# Patient Record
Sex: Male | Born: 1955
Health system: Southern US, Community
[De-identification: ages and names within clinical notes are randomized; demographics above are authoritative.]

## PROBLEM LIST (undated history)

## (undated) DIAGNOSIS — F329 Major depressive disorder, single episode, unspecified: Secondary | ICD-10-CM

## (undated) DIAGNOSIS — N289 Disorder of kidney and ureter, unspecified: Secondary | ICD-10-CM

## (undated) DIAGNOSIS — F32A Depression, unspecified: Secondary | ICD-10-CM

## (undated) DIAGNOSIS — F0150 Vascular dementia without behavioral disturbance: Secondary | ICD-10-CM

## (undated) DIAGNOSIS — I639 Cerebral infarction, unspecified: Secondary | ICD-10-CM

## (undated) DIAGNOSIS — E785 Hyperlipidemia, unspecified: Secondary | ICD-10-CM

## (undated) DIAGNOSIS — I10 Essential (primary) hypertension: Secondary | ICD-10-CM

## (undated) DIAGNOSIS — Z720 Tobacco use: Secondary | ICD-10-CM

## (undated) HISTORY — PX: APPENDECTOMY: SHX54

## (undated) HISTORY — DX: Vascular dementia, unspecified severity, without behavioral disturbance, psychotic disturbance, mood disturbance, and anxiety: F01.50

## (undated) HISTORY — PX: BACK SURGERY: SHX140

## (undated) HISTORY — PX: INGUINAL HERNIA REPAIR: SUR1180

---

## 2001-04-23 ENCOUNTER — Ambulatory Visit (HOSPITAL_COMMUNITY): Admission: RE | Admit: 2001-04-23 | Discharge: 2001-04-23 | Payer: Self-pay | Admitting: Pulmonary Disease

## 2001-07-26 ENCOUNTER — Other Ambulatory Visit: Admission: RE | Admit: 2001-07-26 | Discharge: 2001-07-26 | Payer: Self-pay | Admitting: Dermatology

## 2001-11-22 ENCOUNTER — Other Ambulatory Visit: Admission: RE | Admit: 2001-11-22 | Discharge: 2001-11-22 | Payer: Self-pay | Admitting: Dermatology

## 2003-04-04 ENCOUNTER — Emergency Department (HOSPITAL_COMMUNITY): Admission: EM | Admit: 2003-04-04 | Discharge: 2003-04-05 | Payer: Self-pay | Admitting: Emergency Medicine

## 2003-12-01 ENCOUNTER — Ambulatory Visit (HOSPITAL_COMMUNITY): Admission: RE | Admit: 2003-12-01 | Discharge: 2003-12-01 | Payer: Self-pay | Admitting: Otolaryngology

## 2005-04-24 ENCOUNTER — Ambulatory Visit (HOSPITAL_COMMUNITY): Admission: RE | Admit: 2005-04-24 | Discharge: 2005-04-24 | Payer: Self-pay | Admitting: Surgery

## 2006-10-10 HISTORY — PX: COLONOSCOPY: SHX174

## 2006-10-13 ENCOUNTER — Ambulatory Visit: Payer: Self-pay | Admitting: Gastroenterology

## 2006-10-27 ENCOUNTER — Ambulatory Visit: Payer: Self-pay | Admitting: Gastroenterology

## 2008-07-31 ENCOUNTER — Encounter: Admission: RE | Admit: 2008-07-31 | Discharge: 2008-07-31 | Payer: Self-pay | Admitting: Neurosurgery

## 2008-08-08 ENCOUNTER — Ambulatory Visit (HOSPITAL_COMMUNITY): Admission: RE | Admit: 2008-08-08 | Discharge: 2008-08-09 | Payer: Self-pay | Admitting: Neurosurgery

## 2009-02-09 ENCOUNTER — Encounter: Admission: RE | Admit: 2009-02-09 | Discharge: 2009-02-09 | Payer: Self-pay | Admitting: Neurosurgery

## 2010-12-02 ENCOUNTER — Encounter: Payer: Self-pay | Admitting: Family Medicine

## 2011-03-25 NOTE — Op Note (Signed)
[name], [name]                ACCOUNT NO.:  [id number]   MEDICAL RECORD NO.:  [id number]          PATIENT TYPE:  OIB   LOCATION:  3534                         FACILITY:  [facility]   PHYSICIAN:  Hilda Lias, M.D.   DATE OF BIRTH:  [birth year]   DATE OF PROCEDURE:  08/08/2008  DATE OF DISCHARGE:                               OPERATIVE REPORT   PREOPERATIVE DIAGNOSIS:  Left L5-S1 stenosis with chronic radiculopathy.   POSTOPERATIVE DIAGNOSIS:  Left L5-S1 stenosis with chronic  radiculopathy.   PROCEDURE:  Left L5-S1 laminotomy with foraminotomy to decompression of  the L5-S1 nerve root.  Microscope.   SURGEON:  Hilda Lias, MD   ASSISTANT:  Channing Mutters.   CLINICAL HISTORY:  [name] is a 55 years old gentleman complaining of  back and left leg pain.  The patient has cervical stenosis secondary to  hypertrophy L5-S1 to the left; right side is normal.  The patient did  not want any conservative treatment.  He wanted to proceed with surgery  because the pain was getting worse.   PROCEDURE:  The patient was taken to the OR and he was positioned in a  prone manner.  The back was cleaned with DuraPrep.  X-rays showed that  we were right below L5-S1.  This was reviewed with the Cardiologist.  An  incision was made and retraction was noted in the lateral side.  With  the microscope and the drill, we drilled below the level of L5, the  upper of S1.  We removed a thick, calcified ligament.  The patient has  quite a bit of hypertrophic facet compromising half of the intralaminar  space.  With the drill, we drilled the facet.  We decompressed the L5-S1  nerve root.  Good decompression was achieved.  Valsalva maneuver was  negative.  Then, the area was irrigated.  Fentanyl and Depo-Medrol were  left in the epidural space.  The wound was closed with Vicryl and Steri-  Strips.           ______________________________  Hilda Lias, M.D.     EB/MEDQ  D:  08/08/2008  T:  08/09/2008   Job:  [id number]

## 2011-03-28 NOTE — Op Note (Signed)
[facility], Charles Crawford                ACCOUNT NO.:  [id number]   MEDICAL RECORD NO.:  [id number]          PATIENT TYPE:  AMB   LOCATION:  DAY                          FACILITY:  [facility]   PHYSICIAN:  Velora Heckler, MD      DATE OF BIRTH:  Nov 05, 1956   DATE OF PROCEDURE:  04/24/2005  DATE OF DISCHARGE:                                 OPERATIVE REPORT   PREOPERATIVE DIAGNOSIS:  Right inguinal hernia.   POSTOPERATIVE DIAGNOSIS:  Right inguinal hernia, direct   PROCEDURE:  Repair right inguinal hernia with Prolene mesh.   SURGEON:  Velora Heckler, MD   ANESTHESIA:  General.   ESTIMATED BLOOD LOSS:  Minimal.   PREPARATION:  Betadine.   COMPLICATIONS:  None.   INDICATIONS:  The patient is a 55 year old white male who presents at the  request of [name] at [facility] for right groin pain and  nausea. The patient has developed a symptomatic right inguinal hernia. He  was seen in the office two days ago and noted to have a reducible yet  symptomatic right inguinal hernia. He now comes to surgery for repair.   DESCRIPTION OF PROCEDURE:  The procedure was done in OR #11 at the [facility]. The patient is brought to the operating room,  placed in supine position on the operating room table. Following the  administration of general anesthesia, the patient is prepped and draped in  the usual strict aseptic fashion. After ascertaining that an adequate level  of anesthesia had been obtained, a right inguinal incision was made with a  #10 blade. Dissection was carried down through skin and subcutaneous  tissues, hemostasis was obtained with electrocautery. The external oblique  fascia is incised in line with its fibers and extended through the external  inguinal ring. Cord structures are dissected out of the inguinal canal and  encircled with a Penrose drain. There is a moderate size direct inguinal  hernia sac. This was dissected away from the cord  structures and reduced  through the floor of the inguinal canal. It is held in reduction with  interrupted 2-0 silk sutures. The floor of the inguinal canal was then  recreated with a sheet of Prolene mesh. The mesh is secured to the pubic  tubercle and along the inguinal ligament with a running 2-0 Novofil suture.  The superior margin of the mesh was secured to the transversalis and  internal oblique fascia with interrupted 2-0 Novofil sutures. The mesh is  split to accommodate the cord structures. The tails of mesh are overlapped  lateral to the cord structures and secured to the inguinal ligament with an  interrupted 2-0 Novofil suture. Good hemostasis is noted. Cord is explored  and there is no evidence of indirect inguinal hernia. Local field block is  placed with Marcaine. Cord structures are returned to the inguinal canal.  External oblique fascia is closed with interrupted 3-0 Vicryl sutures. The  subcutaneous tissues are closed with interrupted through a Vicryl sutures.  Skin is anesthetized with local anesthetic. The skin is closed with a  running 4-0  Vicryl subcuticular suture. Wound is washed and dried and Benzoin and Steri-  Strips are applied. Sterile dressings are applied. The patient is awakened  from anesthesia and brought to the recovery room in stable condition. The  patient tolerated the procedure well.       TMG/MEDQ  D:  04/24/2005  T:  04/24/2005  Job:  [id number]   cc:   [facility], M.D.  [address]  [place]  [place] [address]  Fax: [phone]   [name]  [address]  [place]  [place] [address]  Fax: [phone]

## 2011-08-11 LAB — CBC
HCT: 41.6
Hemoglobin: 14.3
Platelets: 283
RBC: 4.23
WBC: 5.2

## 2011-08-11 LAB — COMPREHENSIVE METABOLIC PANEL
AST: 30
Albumin: 3.8
Alkaline Phosphatase: 54
BUN: 11
CO2: 28
Chloride: 105
GFR calc Af Amer: >60
Potassium: 4.9
Total Bilirubin: 0.7

## 2016-09-12 ENCOUNTER — Encounter: Payer: Self-pay | Admitting: Internal Medicine

## 2016-09-12 ENCOUNTER — Encounter: Payer: Self-pay | Admitting: Gastroenterology

## 2017-08-07 ENCOUNTER — Encounter (HOSPITAL_COMMUNITY): Payer: Self-pay | Admitting: *Deleted

## 2017-08-07 ENCOUNTER — Observation Stay (HOSPITAL_COMMUNITY): Payer: Self-pay

## 2017-08-07 ENCOUNTER — Emergency Department (HOSPITAL_COMMUNITY): Payer: Self-pay

## 2017-08-07 ENCOUNTER — Inpatient Hospital Stay (HOSPITAL_COMMUNITY)
Admission: EM | Admit: 2017-08-07 | Discharge: 2017-08-12 | DRG: 065 | Disposition: A | Discharge status: Home / Self Care | Payer: Self-pay | Attending: Internal Medicine | Admitting: Internal Medicine

## 2017-08-07 DIAGNOSIS — I4891 Unspecified atrial fibrillation: Secondary | ICD-10-CM | POA: Diagnosis present

## 2017-08-07 DIAGNOSIS — F32A Depression, unspecified: Secondary | ICD-10-CM | POA: Diagnosis present

## 2017-08-07 DIAGNOSIS — N289 Disorder of kidney and ureter, unspecified: Secondary | ICD-10-CM

## 2017-08-07 DIAGNOSIS — E538 Deficiency of other specified B group vitamins: Secondary | ICD-10-CM | POA: Diagnosis present

## 2017-08-07 DIAGNOSIS — R471 Dysarthria and anarthria: Secondary | ICD-10-CM | POA: Diagnosis present

## 2017-08-07 DIAGNOSIS — F329 Major depressive disorder, single episode, unspecified: Secondary | ICD-10-CM | POA: Diagnosis present

## 2017-08-07 DIAGNOSIS — Z87891 Personal history of nicotine dependence: Secondary | ICD-10-CM

## 2017-08-07 DIAGNOSIS — I10 Essential (primary) hypertension: Secondary | ICD-10-CM | POA: Diagnosis present

## 2017-08-07 DIAGNOSIS — R29701 NIHSS score 1: Secondary | ICD-10-CM | POA: Diagnosis present

## 2017-08-07 DIAGNOSIS — N179 Acute kidney failure, unspecified: Secondary | ICD-10-CM | POA: Diagnosis not present

## 2017-08-07 DIAGNOSIS — Z23 Encounter for immunization: Secondary | ICD-10-CM

## 2017-08-07 DIAGNOSIS — Z8673 Personal history of transient ischemic attack (TIA), and cerebral infarction without residual deficits: Secondary | ICD-10-CM | POA: Diagnosis present

## 2017-08-07 DIAGNOSIS — R9089 Other abnormal findings on diagnostic imaging of central nervous system: Secondary | ICD-10-CM | POA: Diagnosis present

## 2017-08-07 DIAGNOSIS — E785 Hyperlipidemia, unspecified: Secondary | ICD-10-CM | POA: Diagnosis present

## 2017-08-07 DIAGNOSIS — Z79899 Other long term (current) drug therapy: Secondary | ICD-10-CM

## 2017-08-07 DIAGNOSIS — I639 Cerebral infarction, unspecified: Principal | ICD-10-CM | POA: Diagnosis present

## 2017-08-07 DIAGNOSIS — R4781 Slurred speech: Secondary | ICD-10-CM

## 2017-08-07 HISTORY — DX: Major depressive disorder, single episode, unspecified: F32.9

## 2017-08-07 HISTORY — DX: Depression, unspecified: F32.A

## 2017-08-07 LAB — CBC WITH DIFFERENTIAL/PLATELET
Basophils Absolute: 0 10*3/uL (ref 0.0–0.1)
Basophils Relative: 0 %
EOS ABS: 0 10*3/uL (ref 0.0–0.7)
EOS PCT: 0 %
HCT: 45.7 % (ref 39.0–52.0)
HEMOGLOBIN: 15.5 g/dL (ref 13.0–17.0)
LYMPHS ABS: 1.6 10*3/uL (ref 0.7–4.0)
LYMPHS PCT: 30 %
MCH: 31.8 pg (ref 26.0–34.0)
MCHC: 33.9 g/dL (ref 30.0–36.0)
MCV: 93.6 fL (ref 78.0–100.0)
MONOS PCT: 8 %
Monocytes Absolute: 0.4 10*3/uL (ref 0.1–1.0)
Neutro Abs: 3.2 10*3/uL (ref 1.7–7.7)
Neutrophils Relative %: 62 %
PLATELETS: 257 10*3/uL (ref 150–400)
RBC: 4.88 MIL/uL (ref 4.22–5.81)
RDW: 12.6 % (ref 11.5–15.5)
WBC: 5.2 10*3/uL (ref 4.0–10.5)

## 2017-08-07 LAB — CBG MONITORING, ED: Glucose-Capillary: 113 mg/dL — ABNORMAL HIGH (ref 65–99)

## 2017-08-07 LAB — BASIC METABOLIC PANEL
Anion gap: 11 (ref 5–15)
BUN: 16 mg/dL (ref 6–20)
CHLORIDE: 101 mmol/L (ref 101–111)
CO2: 29 mmol/L (ref 22–32)
CREATININE: 1.28 mg/dL — AB (ref 0.61–1.24)
Calcium: 9.4 mg/dL (ref 8.9–10.3)
GFR calc Af Amer: >60 mL/min (ref >60)
GFR calc non Af Amer: 59 mL/min — ABNORMAL LOW (ref >60)
GLUCOSE: 104 mg/dL — AB (ref 65–99)
POTASSIUM: 3.8 mmol/L (ref 3.5–5.1)
Sodium: 141 mmol/L (ref 135–145)

## 2017-08-07 LAB — ETHANOL: Alcohol, Ethyl (B): <10 mg/dL (ref <10)

## 2017-08-07 MED ORDER — ACETAMINOPHEN 650 MG RE SUPP: 650.0000 mg | RECTAL | Status: DC | PRN

## 2017-08-07 MED ORDER — NICOTINE 21 MG/24HR TD PT24
21.0000 mg | MEDICATED_PATCH | Freq: Every day | TRANSDERMAL | Status: DC
  Administered 2017-08-08 – 2017-08-12 (×5): 21 mg via TRANSDERMAL
  Filled 2017-08-07 (×6): qty 1

## 2017-08-07 MED ORDER — STROKE: EARLY STAGES OF RECOVERY BOOK
Freq: Once | Status: AC
  Administered 2017-08-09: 10:00:00
  Filled 2017-08-07: qty 1

## 2017-08-07 MED ORDER — FAMOTIDINE 20 MG PO TABS
20.0000 mg | ORAL_TABLET | Freq: Once | ORAL | Status: AC
  Administered 2017-08-07: 20 mg via ORAL
  Filled 2017-08-07: qty 1

## 2017-08-07 MED ORDER — ACETAMINOPHEN 500 MG PO TABS
1000.0000 mg | ORAL_TABLET | Freq: Once | ORAL | Status: AC
  Administered 2017-08-08: 1000 mg via ORAL
  Filled 2017-08-07: qty 2

## 2017-08-07 MED ORDER — FENTANYL CITRATE (PF) 100 MCG/2ML IJ SOLN
25.0000 ug | INTRAMUSCULAR | Status: DC | PRN
  Administered 2017-08-07 (×2): 25 ug via INTRAVENOUS
  Filled 2017-08-07 (×2): qty 2

## 2017-08-07 MED ORDER — INFLUENZA VAC SPLIT QUAD 0.5 ML IM SUSY
0.5000 mL | PREFILLED_SYRINGE | INTRAMUSCULAR | Status: AC
  Administered 2017-08-08: 0.5 mL via INTRAMUSCULAR
  Filled 2017-08-07: qty 0.5

## 2017-08-07 MED ORDER — ACETAMINOPHEN 160 MG/5ML PO SOLN: 650.0000 mg | ORAL | Status: DC | PRN

## 2017-08-07 MED ORDER — ACETAMINOPHEN 325 MG PO TABS
650.0000 mg | ORAL_TABLET | ORAL | Status: DC | PRN
  Administered 2017-08-08 (×2): 650 mg via ORAL
  Filled 2017-08-07 (×3): qty 2

## 2017-08-07 MED ORDER — ASPIRIN 325 MG PO TABS
325.0000 mg | ORAL_TABLET | Freq: Every day | ORAL | Status: DC
  Administered 2017-08-09 – 2017-08-12 (×4): 325 mg via ORAL
  Filled 2017-08-07 (×5): qty 1

## 2017-08-07 MED ORDER — ONDANSETRON HCL 4 MG/2ML IJ SOLN
4.0000 mg | Freq: Once | INTRAMUSCULAR | Status: AC
  Administered 2017-08-07: 4 mg via INTRAVENOUS
  Filled 2017-08-07: qty 2

## 2017-08-07 MED ORDER — ASPIRIN 300 MG RE SUPP
300.0000 mg | Freq: Every day | RECTAL | Status: DC
  Administered 2017-08-08: 300 mg via RECTAL
  Filled 2017-08-07: qty 1

## 2017-08-07 MED ORDER — PNEUMOCOCCAL VAC POLYVALENT 25 MCG/0.5ML IJ INJ
0.5000 mL | INJECTION | INTRAMUSCULAR | Status: AC
  Administered 2017-08-08: 0.5 mL via INTRAMUSCULAR
  Filled 2017-08-07: qty 0.5

## 2017-08-07 MED ORDER — LORAZEPAM 2 MG/ML IJ SOLN
1.0000 mg | Freq: Once | INTRAMUSCULAR | Status: AC
  Administered 2017-08-07: 1 mg via INTRAVENOUS
  Filled 2017-08-07: qty 1

## 2017-08-07 MED ORDER — ASPIRIN 81 MG PO CHEW
324.0000 mg | CHEWABLE_TABLET | Freq: Once | ORAL | Status: AC
  Administered 2017-08-07: 324 mg via ORAL
  Filled 2017-08-07: qty 4

## 2017-08-07 NOTE — H&P (Signed)
History and Physical    ASTER ECKRICH RUE:[id number] DOB: 26-Jun-1956 DOA: 08/07/2017  PCP: Toma Deiters, MD  Patient coming from:  home  Chief Complaint:  Slurred speech  HPI: [facility] is a 61 y.o. male with medical history significant of depression only comes in with slurred speech all day which started around 3am this am (18 hours ago).  He feels his mouth is weak and feels funny.no problem with is arms and legs.  No vision changes.  His dentures feel funny is his mouth and he feels like they are gagging him.  He does not take any aspirin products daily.  Denies any facial weakness or numbness except around his mouth.  Pt has abnormality of his ct head, no prev h/o stroke and is referred for admit for possible stroke,    Review of Systems: As per HPI otherwise 10 point review of systems negative.   Past Medical History:  Diagnosis Date  . Depression     Past Surgical History:  Procedure Laterality Date  . APPENDECTOMY    . BACK SURGERY       reports that he has quit smoking. He has never used smokeless tobacco. He reports that he does not drink alcohol or use drugs.  No Known Allergies  History reviewed. No pertinent family history. no premature CAD  Prior to Admission medications   Not on File    Physical Exam: Vitals:   08/07/17 1749 08/07/17 1800 08/07/17 1819  BP: (!) 140/93 (!) 122/94   Pulse: (!) 113 (!) 103   Resp: 16 16   Temp: 98.8 F (37.1 C)    TempSrc: Oral    SpO2: 96% 96% 97%  Weight: 68 kg (150 lb)    Height:  (1.778 m)      Constitutional: NAD, calm, comfortable Vitals:   08/07/17 1749 08/07/17 1800 08/07/17 1819  BP: (!) 140/93 (!) 122/94   Pulse: (!) 113 (!) 103   Resp: 16 16   Temp: 98.8 F (37.1 C)    TempSrc: Oral    SpO2: 96% 96% 97%  Weight: 68 kg (150 lb)    Height:  (1.778 m)     Eyes: PERRL, lids and conjunctivae normal ENMT: Mucous membranes are moist. Posterior pharynx clear of any exudate or  lesions.Normal dentition.  Neck: normal, supple, no masses, no thyromegaly Respiratory: clear to auscultation bilaterally, no wheezing, no crackles. Normal respiratory effort. No accessory muscle use.  Cardiovascular: Regular rate and rhythm, no murmurs / rubs / gallops. No extremity edema. 2+ pedal pulses. No carotid bruits.  Abdomen: no tenderness, no masses palpated. No hepatosplenomegaly. Bowel sounds positive.  Musculoskeletal: no clubbing / cyanosis. No joint deformity upper and lower extremities. Good ROM, no contractures. Normal muscle tone.  Skin: no rashes, lesions, ulcers. No induration Neurologic: CN 2-12 grossly intact. Sensation intact, DTR normal. Strength 5/5 in all 4.  Psychiatric: Normal judgment and insight. Alert and oriented x 3. Normal mood.    Labs on Admission: I have personally reviewed following labs and imaging studies  CBC:  Recent Labs Lab 08/07/17 1827  WBC 5.2  NEUTROABS 3.2  HGB 15.5  HCT 45.7  MCV 93.6  PLT 257   Basic Metabolic Panel:  Recent Labs Lab 08/07/17 1827  NA 141  K 3.8  CL 101  CO2 29  GLUCOSE 104*  BUN 16  CREATININE 1.28*  CALCIUM 9.4   GFR: Estimated Creatinine Clearance: 58.3 mL/min (A) (by C-G formula  based on SCr of 1.28 mg/dL (H)).  CBG:  Recent Labs Lab 08/07/17 1754  GLUCAP 113*   Radiological Exams on Admission: Ct Head Wo Contrast  Result Date: 08/07/2017 CLINICAL DATA:  Headache, slurred speech. Last seen normal yesterday. EXAM: CT HEAD WITHOUT CONTRAST TECHNIQUE: Contiguous axial images were obtained from the base of the skull through the vertex without intravenous contrast. COMPARISON:  Sinus CT December 01, 2003 FINDINGS: BRAIN: No intraparenchymal hemorrhage, mass effect nor midline shift. Old cerebellar infarcts including RIGHT superior cerebellum. Old LEFT frontal and biparietal lobe infarcts. Hypodense RIGHT parietal lobe anterior to area of encephalomalacia. Mild ex vacuo dilatation LEFT occipital  horn. No hydrocephalus. No abnormal extra-axial fluid collections. Basal cisterns are patent. VASCULAR: Mild dense intracranial vessels. SKULL: No skull fracture. No significant scalp soft tissue swelling. SINUSES/ORBITS: The mastoid air-cells and included paranasal sinuses are well-aerated.The included ocular globes and orbital contents are non-suspicious. OTHER: None. IMPRESSION: 1. Old bilateral MCA territory infarcts and multiple old cerebellar infarcts, potentially acute component RIGHT parietal lobe. MRI would be more sensitive. 2. Mildly dense intracranial vessels seen with hemoconcentration. Electronically Signed   By: Awilda Metro M.D.   On: 08/07/2017 19:13    EKG: Independently reviewed. Mild sinus tach, no acute changes Full chart reviewed Case discussed with dr Jodi Mourning  Assessment/Plan 61 yo male with slurred speech concerning for acute CVA  Principal Problem:   Slurred speech/possible CVA - obtain mri/a brain.  Obtain carotid dopplers.  Order cardiac echo.  Ck flp and hga1c in am.  Tele monitoring.  Place on full dose of aspirin.  freq neuro cks overnight.  ST/OT/PT evaluations.  Neuro consulted.  Active Problems:   Renal insufficiency, mild- cr 1.3   Abnormal CT of brain- further eval with mri brain   Med rec pending   DVT prophylaxis:  scds Code Status:  full Family Communication:  none Disposition Plan:  Per day team Consults called:  neurology Admission status:  observation   Jacobus,Vasilios Ottaway A MD [facility]  If 7PM-7AM, please contact night-coverage [email or web address] Password [facility]  08/07/2017, 8:55 PM

## 2017-08-07 NOTE — ED Provider Notes (Signed)
[facility]-EMERGENCY DEPT Provider Note   CSN: [id number] Arrival date & time: 08/07/17  1744     History   Chief Complaint Chief Complaint  Patient presents with  . Cerebrovascular Accident    HPI Charles [facility] is a 61 y.o. male.  Patient presents with slurred speech and headache that started 3:00 this morning. Patient has had upset stomach and diarrhea for the past few days however this is different. No history of stroke. Family history of stroke. Patient has no other neurologic signs or symptoms. Slurred speech is constant and family reports this is definitely different. Patient denies blood thinner use.      Past Medical History:  Diagnosis Date  . Depression     Patient Active Problem List   Diagnosis Date Noted  . Slurred speech 08/07/2017    Past Surgical History:  Procedure Laterality Date  . APPENDECTOMY    . BACK SURGERY         Home Medications    Prior to Admission medications   Not on File    Family History History reviewed. No pertinent family history.  Social History Social History  Substance Use Topics  . Smoking status: Former Games developer  . Smokeless tobacco: Never Used  . Alcohol use No     Comment: former heavy drinker     Allergies   Patient has no known allergies.   Review of Systems Review of Systems  Constitutional: Negative for chills and fever.  HENT: Negative for congestion.   Eyes: Negative for visual disturbance.  Respiratory: Negative for shortness of breath.   Cardiovascular: Negative for chest pain.  Gastrointestinal: Negative for abdominal pain and vomiting.  Genitourinary: Negative for dysuria and flank pain.  Musculoskeletal: Negative for back pain, neck pain and neck stiffness.  Skin: Negative for rash.  Neurological: Positive for speech difficulty. Negative for light-headedness and headaches.     Physical Exam Updated Vital Signs BP (!) 122/94   Pulse (!) 103   Temp 98.8 F (37.1 C) (Oral)   Resp 16    Ht  (1.778 m)   Wt 68 kg (150 lb)   SpO2 97%   BMI 21.52 kg/m   Physical Exam  Constitutional: He is oriented to person, place, and time. He appears well-developed and well-nourished.  HENT:  Head: Normocephalic and atraumatic.  Eyes: Conjunctivae are normal. Right eye exhibits no discharge. Left eye exhibits no discharge.  Neck: Normal range of motion. Neck supple. No tracheal deviation present.  Cardiovascular: Normal rate and regular rhythm.   Pulmonary/Chest: Effort normal and breath sounds normal.  Abdominal: Soft. He exhibits no distension. There is no tenderness. There is no guarding.  Musculoskeletal: He exhibits no edema.  Neurological: He is alert and oriented to person, place, and time.  Patient has mild slurred speech. Possible facial droop difficult to discern. Extraocular muscle function intact pupils equal and responsive light. Patient has equal strength 5+ upper and lower extremities. Sensation intact in all extremity's palpation. Finger-nose intact.  Skin: Skin is warm. No rash noted.  Psychiatric: He has a normal mood and affect.  Nursing note and vitals reviewed.    ED Treatments / Results  Labs (all labs ordered are listed, but only abnormal results are displayed) Labs Reviewed  BASIC METABOLIC PANEL - Abnormal; Notable for the following:       Result Value   Glucose, Bld 104 (*)    Creatinine, Ser 1.28 (*)    GFR calc non Af Amer 59 (*)  All other components within normal limits  CBG MONITORING, ED - Abnormal; Notable for the following:    Glucose-Capillary 113 (*)    All other components within normal limits  CBC WITH DIFFERENTIAL/PLATELET  ETHANOL  URINALYSIS, ROUTINE W REFLEX MICROSCOPIC    EKG  EKG Interpretation None       Radiology Ct Head Wo Contrast  Result Date: 08/07/2017 CLINICAL DATA:  Headache, slurred speech. Last seen normal yesterday. EXAM: CT HEAD WITHOUT CONTRAST TECHNIQUE: Contiguous axial images were obtained from  the base of the skull through the vertex without intravenous contrast. COMPARISON:  Sinus CT December 01, 2003 FINDINGS: BRAIN: No intraparenchymal hemorrhage, mass effect nor midline shift. Old cerebellar infarcts including RIGHT superior cerebellum. Old LEFT frontal and biparietal lobe infarcts. Hypodense RIGHT parietal lobe anterior to area of encephalomalacia. Mild ex vacuo dilatation LEFT occipital horn. No hydrocephalus. No abnormal extra-axial fluid collections. Basal cisterns are patent. VASCULAR: Mild dense intracranial vessels. SKULL: No skull fracture. No significant scalp soft tissue swelling. SINUSES/ORBITS: The mastoid air-cells and included paranasal sinuses are well-aerated.The included ocular globes and orbital contents are non-suspicious. OTHER: None. IMPRESSION: 1. Old bilateral MCA territory infarcts and multiple old cerebellar infarcts, potentially acute component RIGHT parietal lobe. MRI would be more sensitive. 2. Mildly dense intracranial vessels seen with hemoconcentration. Electronically Signed   By: Awilda Metro M.D.   On: 08/07/2017 19:13    Procedures Procedures (including critical care time)  Medications Ordered in ED Medications  fentaNYL (SUBLIMAZE) injection 25 mcg (25 mcg Intravenous Given 08/07/17 1847)  ondansetron (ZOFRAN) injection 4 mg (4 mg Intravenous Given 08/07/17 1850)  famotidine (PEPCID) tablet 20 mg (20 mg Oral Given 08/07/17 1941)  aspirin chewable tablet 324 mg (324 mg Oral Given 08/07/17 1941)     Initial Impression / Assessment and Plan / ED Course  I have reviewed the triage vital signs and the nursing notes.  Pertinent labs & imaging results that were available during my care of the patient were reviewed by me and considered in my medical decision making (see chart for details).    Patient presents with clinical concern for occult stroke. Plan for CT scan, blood work, EKGand likely observation in the hospital.  CT scan showed old stroke and  possibly new stroke. MRI ordered. Paged neurology. Discussed with [facility] for observation. Aspirin ordered.  The patients results and plan were reviewed and discussed.   Any x-rays performed were independently reviewed by myself.   Differential diagnosis were considered with the presenting HPI.  Medications  fentaNYL (SUBLIMAZE) injection 25 mcg (25 mcg Intravenous Given 08/07/17 1847)  ondansetron (ZOFRAN) injection 4 mg (4 mg Intravenous Given 08/07/17 1850)  famotidine (PEPCID) tablet 20 mg (20 mg Oral Given 08/07/17 1941)  aspirin chewable tablet 324 mg (324 mg Oral Given 08/07/17 1941)    Vitals:   08/07/17 1749 08/07/17 1800 08/07/17 1819  BP: (!) 140/93 (!) 122/94   Pulse: (!) 113 (!) 103   Resp: 16 16   Temp: 98.8 F (37.1 C)    TempSrc: Oral    SpO2: 96% 96% 97%  Weight: 68 kg (150 lb)    Height:  (1.778 m)      Final diagnoses:  Slurred speech    Admission/ observation were discussed with the admitting physician, patient and/or family and they are comfortable with the plan.    Final Clinical Impressions(s) / ED Diagnoses   Final diagnoses:  Slurred speech    New Prescriptions New Prescriptions  No medications on file     Blane Ohara, MD 08/07/17 1943

## 2017-08-07 NOTE — ED Notes (Signed)
Patient transported to MRI 

## 2017-08-07 NOTE — ED Triage Notes (Signed)
Pt c/op HA, slurred speech. Last seen normal was yesterday

## 2017-08-07 NOTE — ED Notes (Signed)
Pt back from MRI 

## 2017-08-07 NOTE — ED Notes (Signed)
Pt states that he had a headache and slurred speech  at around 3 am on today.  Pt has also had an upset stomach and was experiencing diarrhea since Wednesday.  Pt has not been able to eat since yesterday.

## 2017-08-08 ENCOUNTER — Observation Stay (HOSPITAL_COMMUNITY): Payer: Self-pay

## 2017-08-08 ENCOUNTER — Observation Stay (HOSPITAL_BASED_OUTPATIENT_CLINIC_OR_DEPARTMENT_OTHER): Payer: Self-pay

## 2017-08-08 ENCOUNTER — Encounter (HOSPITAL_COMMUNITY): Payer: Self-pay | Admitting: Family Medicine

## 2017-08-08 DIAGNOSIS — R9089 Other abnormal findings on diagnostic imaging of central nervous system: Secondary | ICD-10-CM

## 2017-08-08 DIAGNOSIS — F32A Depression, unspecified: Secondary | ICD-10-CM | POA: Diagnosis present

## 2017-08-08 DIAGNOSIS — F329 Major depressive disorder, single episode, unspecified: Secondary | ICD-10-CM | POA: Diagnosis present

## 2017-08-08 DIAGNOSIS — Z8673 Personal history of transient ischemic attack (TIA), and cerebral infarction without residual deficits: Secondary | ICD-10-CM | POA: Diagnosis present

## 2017-08-08 DIAGNOSIS — R9431 Abnormal electrocardiogram [ECG] [EKG]: Secondary | ICD-10-CM

## 2017-08-08 DIAGNOSIS — I639 Cerebral infarction, unspecified: Secondary | ICD-10-CM | POA: Diagnosis present

## 2017-08-08 LAB — ECHOCARDIOGRAM COMPLETE
CHL CUP DOP CALC LVOT VTI: 16.9 cm
CHL CUP MV DEC (S): 299
E/e' ratio: 5.38
EWDT: 299 ms
FS: 35 % (ref 28–44)
HEIGHTINCHES: 70 in
IVS/LV PW RATIO, ED: 0.94
LA ID, A-P, ES: 28 mm
LA diam end sys: 28 mm
LA vol A4C: 32 ml
LA vol: 33.6 mL
LADIAMINDEX: 1.5 cm/m2
LAVOLIN: 18 mL/m2
LV E/e' medial: 5.38
LV E/e'average: 5.38
LV TDI E'LATERAL: 10.9
LV TDI E'MEDIAL: 6.85
LV dias vol index: 40 mL/m2
LV sys vol: 33 mL (ref 21–61)
LVDIAVOL: 75 mL (ref 62–150)
LVELAT: 10.9 cm/s
LVOT area: 3.14 cm2
LVOT peak grad rest: 3 mmHg
LVOTD: 20 mm
LVOTPV: 90.3 cm/s
LVOTSV: 53 mL
LVSYSVOLIN: 18 mL/m2
Lateral S' vel: 11.7 cm/s
MV pk E vel: 58.6 m/s
MVPKAVEL: 85.3 m/s
PW: 9.95 mm — AB (ref 0.6–1.1)
RV TAPSE: 18.6 mm
Reg peak vel: 214 cm/s
Simpson's disk: 56
Stroke v: 42 ml
TRMAXVEL: 214 cm/s
WEIGHTICAEL: 2478.4 [oz_av]

## 2017-08-08 LAB — CBC
HCT: 40.3 % (ref 39.0–52.0)
Hemoglobin: 13.5 g/dL (ref 13.0–17.0)
MCH: 31.5 pg (ref 26.0–34.0)
MCHC: 33.5 g/dL (ref 30.0–36.0)
MCV: 94.2 fL (ref 78.0–100.0)
PLATELETS: 238 10*3/uL (ref 150–400)
RBC: 4.28 MIL/uL (ref 4.22–5.81)
RDW: 12.7 % (ref 11.5–15.5)
WBC: 5.7 10*3/uL (ref 4.0–10.5)

## 2017-08-08 LAB — LIPID PANEL
CHOL/HDL RATIO: 4.3 ratio
Cholesterol: 197 mg/dL (ref 0–200)
HDL: 46 mg/dL (ref >40)
LDL Cholesterol: 137 mg/dL — ABNORMAL HIGH (ref 0–99)
Triglycerides: 70 mg/dL (ref <150)
VLDL: 14 mg/dL (ref 0–40)

## 2017-08-08 LAB — COMPREHENSIVE METABOLIC PANEL
ALBUMIN: 3.7 g/dL (ref 3.5–5.0)
ALK PHOS: 83 U/L (ref 38–126)
ALT: 17 U/L (ref 17–63)
AST: 21 U/L (ref 15–41)
Anion gap: 11 (ref 5–15)
BILIRUBIN TOTAL: 1 mg/dL (ref 0.3–1.2)
BUN: 18 mg/dL (ref 6–20)
CALCIUM: 8.8 mg/dL — AB (ref 8.9–10.3)
CO2: 27 mmol/L (ref 22–32)
CREATININE: 1.29 mg/dL — AB (ref 0.61–1.24)
Chloride: 100 mmol/L — ABNORMAL LOW (ref 101–111)
GFR calc Af Amer: >60 mL/min (ref >60)
GFR, EST NON AFRICAN AMERICAN: 58 mL/min — AB (ref >60)
GLUCOSE: 82 mg/dL (ref 65–99)
Potassium: 3.8 mmol/L (ref 3.5–5.1)
Sodium: 138 mmol/L (ref 135–145)
TOTAL PROTEIN: 6.8 g/dL (ref 6.5–8.1)

## 2017-08-08 LAB — HEMOGLOBIN A1C
HEMOGLOBIN A1C: 5 % (ref 4.8–5.6)
MEAN PLASMA GLUCOSE: 96.8 mg/dL

## 2017-08-08 MED ORDER — ESCITALOPRAM OXALATE 10 MG PO TABS
20.0000 mg | ORAL_TABLET | Freq: Every day | ORAL | Status: DC
  Administered 2017-08-08 – 2017-08-12 (×5): 20 mg via ORAL
  Filled 2017-08-08 (×5): qty 2

## 2017-08-08 MED ORDER — SODIUM CHLORIDE 0.9 % IV SOLN
INTRAVENOUS | Status: AC
  Administered 2017-08-08 (×2): via INTRAVENOUS

## 2017-08-08 MED ORDER — ALPRAZOLAM 0.25 MG PO TABS
0.2500 mg | ORAL_TABLET | Freq: Three times a day (TID) | ORAL | Status: DC | PRN
  Administered 2017-08-08 – 2017-08-11 (×8): 0.25 mg via ORAL
  Filled 2017-08-08 (×8): qty 1

## 2017-08-08 MED ORDER — MIRTAZAPINE 15 MG PO TABS
45.0000 mg | ORAL_TABLET | Freq: Every day | ORAL | Status: DC
  Administered 2017-08-08 – 2017-08-11 (×4): 45 mg via ORAL
  Filled 2017-08-08 (×4): qty 3

## 2017-08-08 MED ORDER — ATORVASTATIN CALCIUM 40 MG PO TABS
40.0000 mg | ORAL_TABLET | Freq: Every day | ORAL | Status: DC
  Administered 2017-08-08 – 2017-08-11 (×4): 40 mg via ORAL
  Filled 2017-08-08 (×4): qty 1

## 2017-08-08 MED ORDER — IOPAMIDOL (ISOVUE-370) INJECTION 76%
75.0000 mL | Freq: Once | INTRAVENOUS | Status: AC | PRN
  Administered 2017-08-08: 75 mL via INTRAVENOUS

## 2017-08-08 NOTE — Evaluation (Signed)
Physical Therapy Evaluation Patient Details Name: Charles Crawford MRN: [id number] DOB: 1955/11/15 Today's Date: 08/08/2017   History of Present Illness   Charles [facility] is a 61 y.o. male with medical history signif)  Clinical Impression  Patient functioning at baseline level other than difficulty speaking.  PLAN: patient discharged from physical therapy to care of nursing staff for ambulation daily as tolerated for length of stay with recommendations stated below.    Follow Up Recommendations No PT follow up    Equipment Recommendations  None recommended by PT    Recommendations for Other Services Speech consult     Precautions / Restrictions Precautions Precautions: None Restrictions Weight Bearing Restrictions: No Other Position/Activity Restrictions:  (none)      Mobility  Bed Mobility Overal bed mobility: Independent                Transfers Overall transfer level: Independent                  Ambulation/Gait Ambulation/Gait assistance: Independent Ambulation Distance (Feet): 150 Feet Assistive device: None Gait Pattern/deviations: WFL(Within Functional Limits) Gait velocity:  (WNL)      Stairs Stairs: Yes Stairs assistance: Modified independent (Device/Increase time) Stair Management: One rail Right Number of Stairs: 5 General stair comments:  (good return going up/down stairs without loss of balance)  Wheelchair Mobility    Modified Rankin (Stroke Patients Only)       Balance Overall balance assessment: Independent                                           Pertinent Vitals/Pain      Home Living Family/patient expects to be discharged to:: Private residence Living Arrangements: Alone Available Help at Discharge: Family;Friend(s) Type of Home: House Home Access: Stairs to enter Entrance Stairs-Rails: Left Entrance Stairs-Number of Steps: 3 (3) Home Layout: One level Home Equipment: None      Prior  Function Level of Independence: Independent               Hand Dominance   Dominant Hand: Right    Extremity/Trunk Assessment   Upper Extremity Assessment Upper Extremity Assessment: Overall WFL for tasks assessed    Lower Extremity Assessment Lower Extremity Assessment: Overall WFL for tasks assessed       Communication   Communication: No difficulties  Cognition Arousal/Alertness: Awake/alert Behavior During Therapy: WFL for tasks assessed/performed Overall Cognitive Status: Within Functional Limits for tasks assessed                                        General Comments      Exercises     Assessment/Plan    PT Assessment Patent does not need any further PT services  PT Problem List         PT Treatment Interventions      PT Goals (Current goals can be found in the Care Plan section)  Acute Rehab PT Goals Patient Stated Goal:  (return home) PT Goal Formulation: With patient Time For Goal Achievement: 08/08/17 Potential to Achieve Goals: Good    Frequency     Barriers to discharge        Co-evaluation               AM-PAC PT "6  Clicks" Daily Activity  Outcome Measure Difficulty turning over in bed (including adjusting bedclothes, sheets and blankets)?: None Difficulty moving from lying on back to sitting on the side of the bed? : None Difficulty sitting down on and standing up from a chair with arms (e.g., wheelchair, bedside commode, etc,.)?: None Help needed moving to and from a bed to chair (including a wheelchair)?: None Help needed walking in hospital room?: None Help needed climbing 3-5 steps with a railing? : None 6 Click Score: 24    End of Session   Activity Tolerance: Patient tolerated treatment well Patient left: in bed;with call bell/phone within reach Nurse Communication: Mobility status PT Visit Diagnosis: Unsteadiness on feet (R26.81);Other abnormalities of gait and mobility (R26.89);Muscle weakness  (generalized) (M62.81)    Time: 1000-1026 PT Time Calculation (min) (ACUTE ONLY): 26 min   Charges:   PT Evaluation $PT Eval Low Complexity: 1 Low PT Treatments $Gait Training: 23-37 mins   PT G Codes:   PT G-Codes **NOT FOR INPATIENT CLASS** Functional Assessment Tool Used: AM-PAC 6 Clicks Basic Mobility Functional Limitation: Mobility: Walking and moving around Mobility: Walking and Moving Around Current Status (N8295): 0 percent impaired, limited or restricted Mobility: Walking and Moving Around Goal Status (A2130): 0 percent impaired, limited or restricted Mobility: Walking and Moving Around Discharge Status (Q6578): 0 percent impaired, limited or restricted      11:42 AM, 08/08/17 Ocie Bob, MPT Physical Therapist with [facility] [phone] office [phone] mobile phone

## 2017-08-08 NOTE — Progress Notes (Signed)
*  PRELIMINARY RESULTS* Echocardiogram 2D Echocardiogram has been performed.  Stacey Drain 08/08/2017, 1:49 PM

## 2017-08-08 NOTE — Progress Notes (Signed)
PROGRESS NOTE   Charles Crawford  ZOX:[id number]  DOB: 08/30/1956  DOA: 08/07/2017 PCP: Toma Deiters, MD  Brief Admission Hx: Charles Crawford is a 61 y.o. male with medical history significant of depression only comes in with slurred speech all day which started around 3am this am (18 hours ago).  He was admitted with acute CVA.    MDM/Assessment & Plan:   1. Acute right frontal lobe / MCA territory infarct - Admit to telemetry, neuro consult, aspirin for antiplatelet therapy, he has multiple old cerebellar infarcts - radiology recommending CTA head/neck which has been ordered.  Echo pending, check lipids, A1c, carotids.  PT/OT evaluation.  2. Renal insufficiency - unsure if this is acute or chronic, recheck in AM. Gentle hydration ordered.   3. Depression - resume home medications.   4. Hypertension - allow permissive hypertension.    DVT prophylaxis: SCDs Code Status: FULL  Family Communication: not present at bedside Disposition Plan: TBD   Subjective: Pt without complaints. Eating and drinking.    Objective: Vitals:   08/08/17 0012 08/08/17 0209 08/08/17 0411 08/08/17 0601  BP: 99/68 (!) 91/48 94/64   Pulse: 95 79 72   Resp: Temp: (!) 97.5 F (36.4 C) (!) 97.5 F (36.4 C) 98 F (36.7 C) 98.1 F (36.7 C)  TempSrc: Oral Oral Oral Oral  SpO2: 97% 92% 96% 92%  Weight:      Height:        Intake/Output Summary (Last 24 hours) at 08/08/17 1139 Last data filed at 08/08/17 0900  Gross per 24 hour  Intake              240 ml  Output              100 ml  Net              140 ml   Filed Weights   08/07/17 1749 08/07/17 2219  Weight: 68 kg (150 lb) 70.3 kg (154 lb 14.4 oz)   REVIEW OF SYSTEMS  As per history otherwise all reviewed and reported negative  Exam:  General exam: thin male, well developed, NAD.  Respiratory system:  No increased work of breathing. Cardiovascular system: S1 & S2 heard. No JVD, murmurs, gallops, clicks or pedal  edema. Gastrointestinal system: Abdomen is nondistended, soft and nontender. Normal bowel sounds heard. Central nervous system: Alert and oriented. No speech deficits. Extremities: no CCE.  Data Reviewed: Basic Metabolic Panel:  Recent Labs Lab 08/07/17 1827 08/08/17 0611  NA 141 138  K 3.8 3.8  CL 101 100*  CO2 29 27  GLUCOSE 104* 82  BUN 16 18  CREATININE 1.28* 1.29*  CALCIUM 9.4 8.8*   Liver Function Tests:  Recent Labs Lab 08/08/17 0611  AST 21  ALT 17  ALKPHOS 83  BILITOT 1.0  PROT 6.8  ALBUMIN 3.7   No results for input(s): LIPASE, AMYLASE in the last 168 hours. No results for input(s): AMMONIA in the last 168 hours. CBC:  Recent Labs Lab 08/07/17 1827 08/08/17 0611  WBC 5.2 5.7  NEUTROABS 3.2  --   HGB 15.5 13.5  HCT 45.7 40.3  MCV 93.6 94.2  PLT 257 238   Cardiac Enzymes: No results for input(s): CKTOTAL, CKMB, CKMBINDEX, TROPONINI in the last 168 hours. CBG (last 3)   Recent Labs  08/07/17 1754  GLUCAP 113*   No results found for this or any previous visit (from the past 240  hour(s)).   Studies: Ct Angio Head W Or Wo Contrast  Result Date: 08/08/2017 CLINICAL DATA:  Headache and slurred speech. Acute right MCA infarct on MRI. EXAM: CT ANGIOGRAPHY HEAD AND NECK TECHNIQUE: Multidetector CT imaging of the head and neck was performed using the standard protocol during bolus administration of intravenous contrast. Multiplanar CT image reconstructions and MIPs were obtained to evaluate the vascular anatomy. Carotid stenosis measurements (when applicable) are obtained utilizing NASCET criteria, using the distal internal carotid diameter as the denominator. CONTRAST:  75 mL Isovue 370 COMPARISON:  Carotid Doppler ultrasound 08/08/2017. Head MRI/ MRA 08/07/2017. FINDINGS: CTA NECK FINDINGS Aortic arch: Normal variant 4 vessel aortic arch with the left vertebral artery arising directly from the arch. Widely patent brachiocephalic and subclavian arteries.  Mild non stenotic soft plaque in the proximal left subclavian artery. Right carotid system: Patent without evidence of stenosis or dissection. Left carotid system: Patent without evidence of stenosis or dissection. Vertebral arteries: Patent without evidence of stenosis or dissection. Mildly dominant right vertebral artery. Skeleton: No acute osseous abnormality or suspicious osseous lesion. Mild-to-moderate cervical disc degeneration. Other neck: No mass or lymph node enlargement. Upper chest: Unremarkable. Review of the MIP images confirms the above findings CTA HEAD FINDINGS Anterior circulation: The internal carotid arteries are widely patent from skullbase to carotid termini. The ACAs and MCAs are patent without evidence of proximal branch occlusion or significant stenosis. The left ACA is dominant, and there is an azygos A2 configuration. No aneurysm. Posterior circulation: The intracranial vertebral arteries are patent to the basilar. There is a moderate left V4 stenosis just proximal to the PICA origin, and the more distal left vertebral artery is hypoplastic in appearance. Patent PICA and SCA origins are identified bilaterally. The basilar artery and is widely patent. There are large right and small left posterior communicating arteries with a hypoplastic right P1 segment noted. PCAs are patent without evidence of significant stenosis. No aneurysm. Venous sinuses: Patent. Anatomic variants: Fetal type origin of the right PCA. Dominant left ACA. Delayed phase: No abnormal enhancement. Review of the MIP images confirms the above findings IMPRESSION: 1. Widely patent cervical carotid and vertebral arteries. 2. Moderate left V4 stenosis. 3. No significant anterior circulation stenosis. Electronically Signed   By: Sebastian Ache M.D.   On: 08/08/2017 11:06   Ct Head Wo Contrast  Result Date: 08/07/2017 CLINICAL DATA:  Headache, slurred speech. Last seen normal yesterday. EXAM: CT HEAD WITHOUT CONTRAST  TECHNIQUE: Contiguous axial images were obtained from the base of the skull through the vertex without intravenous contrast. COMPARISON:  Sinus CT December 01, 2003 FINDINGS: BRAIN: No intraparenchymal hemorrhage, mass effect nor midline shift. Old cerebellar infarcts including RIGHT superior cerebellum. Old LEFT frontal and biparietal lobe infarcts. Hypodense RIGHT parietal lobe anterior to area of encephalomalacia. Mild ex vacuo dilatation LEFT occipital horn. No hydrocephalus. No abnormal extra-axial fluid collections. Basal cisterns are patent. VASCULAR: Mild dense intracranial vessels. SKULL: No skull fracture. No significant scalp soft tissue swelling. SINUSES/ORBITS: The mastoid air-cells and included paranasal sinuses are well-aerated.The included ocular globes and orbital contents are non-suspicious. OTHER: None. IMPRESSION: 1. Old bilateral MCA territory infarcts and multiple old cerebellar infarcts, potentially acute component RIGHT parietal lobe. MRI would be more sensitive. 2. Mildly dense intracranial vessels seen with hemoconcentration. Electronically Signed   By: Awilda Metro M.D.   On: 08/07/2017 19:13   Ct Angio Neck W Or Wo Contrast  Result Date: 08/08/2017 CLINICAL DATA:  Headache and slurred speech. Acute right  MCA infarct on MRI. EXAM: CT ANGIOGRAPHY HEAD AND NECK TECHNIQUE: Multidetector CT imaging of the head and neck was performed using the standard protocol during bolus administration of intravenous contrast. Multiplanar CT image reconstructions and MIPs were obtained to evaluate the vascular anatomy. Carotid stenosis measurements (when applicable) are obtained utilizing NASCET criteria, using the distal internal carotid diameter as the denominator. CONTRAST:  75 mL Isovue 370 COMPARISON:  Carotid Doppler ultrasound 08/08/2017. Head MRI/ MRA 08/07/2017. FINDINGS: CTA NECK FINDINGS Aortic arch: Normal variant 4 vessel aortic arch with the left vertebral artery arising directly from  the arch. Widely patent brachiocephalic and subclavian arteries. Mild non stenotic soft plaque in the proximal left subclavian artery. Right carotid system: Patent without evidence of stenosis or dissection. Left carotid system: Patent without evidence of stenosis or dissection. Vertebral arteries: Patent without evidence of stenosis or dissection. Mildly dominant right vertebral artery. Skeleton: No acute osseous abnormality or suspicious osseous lesion. Mild-to-moderate cervical disc degeneration. Other neck: No mass or lymph node enlargement. Upper chest: Unremarkable. Review of the MIP images confirms the above findings CTA HEAD FINDINGS Anterior circulation: The internal carotid arteries are widely patent from skullbase to carotid termini. The ACAs and MCAs are patent without evidence of proximal branch occlusion or significant stenosis. The left ACA is dominant, and there is an azygos A2 configuration. No aneurysm. Posterior circulation: The intracranial vertebral arteries are patent to the basilar. There is a moderate left V4 stenosis just proximal to the PICA origin, and the more distal left vertebral artery is hypoplastic in appearance. Patent PICA and SCA origins are identified bilaterally. The basilar artery and is widely patent. There are large right and small left posterior communicating arteries with a hypoplastic right P1 segment noted. PCAs are patent without evidence of significant stenosis. No aneurysm. Venous sinuses: Patent. Anatomic variants: Fetal type origin of the right PCA. Dominant left ACA. Delayed phase: No abnormal enhancement. Review of the MIP images confirms the above findings IMPRESSION: 1. Widely patent cervical carotid and vertebral arteries. 2. Moderate left V4 stenosis. 3. No significant anterior circulation stenosis. Electronically Signed   By: Sebastian Ache M.D.   On: 08/08/2017 11:06   Mr Maxine Glenn Head Wo Contrast  Result Date: 08/07/2017 CLINICAL DATA:  Headache and slurred  speech. Follow-up abnormal head CT. EXAM: MRI HEAD WITHOUT CONTRAST MRA HEAD WITHOUT CONTRAST TECHNIQUE: Multiplanar, multiecho pulse sequences of the brain and surrounding structures were obtained without intravenous contrast. Angiographic images of the head were obtained using MRA technique without contrast. COMPARISON:  CT HEAD August 07, 2017 at 1858 hours FINDINGS: MRI HEAD FINDINGS- mildly motion degraded examination. BRAIN: 1.6 x 2.2 cm reduced diffusion RIGHT posterior frontal lobe with low ADC values. Punctate focus of reduced diffusion LEFT parietal lobe with low ADC value. Faint susceptibility artifact within biparietal encephalomalacia compatible with mineralization. Old bilateral cerebellar infarcts. Old small bifrontal infarcts. No midline shift, mass effect or masses. Mild prominence of ventricles sulci for patient's age. VASCULAR: Normal major intracranial vascular flow voids present at skull base. SKULL AND UPPER CERVICAL SPINE: No abnormal sellar expansion. No suspicious calvarial bone marrow signal. Craniocervical junction maintained. SINUSES/ORBITS: The mastoid air-cells and included paranasal sinuses are well-aerated. The included ocular globes and orbital contents are non-suspicious. OTHER: None. MRA HEAD FINDINGS ANTERIOR CIRCULATION: Normal flow related enhancement of the included cervical, petrous, cavernous and supraclinoid internal carotid arteries. Patent anterior communicating artery. Mild stenosis distal RIGHT A1 segment. Patent anterior and middle cerebral arteries, including distal segments. No large  vessel occlusion, high-grade stenosis, aneurysm. POSTERIOR CIRCULATION: RIGHT vertebral artery is dominant. Flow limiting stenosis LEFT vertebral artery. Basilar artery is patent, with normal flow related enhancement of the main branch vessels. Patent posterior cerebral arteries. Robust RIGHT posterior communicating artery, small on the LEFT. No large vessel occlusion, aneurysm.  ANATOMIC VARIANTS: Azygos ACA. Source images and MIP images were reviewed. IMPRESSION: MRI head: 1. Acute small RIGHT frontal lobe/MCA territory infarct. 2. Multifocal old bilateral frontoparietal/MCA territory infarcts. Multiple old cerebellar infarcts. 3. Mild chronic small vessel ischemic disease. 4. Mild parenchymal brain volume loss for age. MRA head: 1. No acute large vessel occlusion. 2. Flow-limiting stenosis LEFT vertebral artery. Moderate stenosis RIGHT A1 segment. 3. Given numerous intracranial infarctions, recommend CT angiogram head and neck. Electronically Signed   By: Awilda Metro M.D.   On: 08/07/2017 21:14   Mr Brain Wo Contrast  Result Date: 08/07/2017 CLINICAL DATA:  Headache and slurred speech. Follow-up abnormal head CT. EXAM: MRI HEAD WITHOUT CONTRAST MRA HEAD WITHOUT CONTRAST TECHNIQUE: Multiplanar, multiecho pulse sequences of the brain and surrounding structures were obtained without intravenous contrast. Angiographic images of the head were obtained using MRA technique without contrast. COMPARISON:  CT HEAD August 07, 2017 at 1858 hours FINDINGS: MRI HEAD FINDINGS- mildly motion degraded examination. BRAIN: 1.6 x 2.2 cm reduced diffusion RIGHT posterior frontal lobe with low ADC values. Punctate focus of reduced diffusion LEFT parietal lobe with low ADC value. Faint susceptibility artifact within biparietal encephalomalacia compatible with mineralization. Old bilateral cerebellar infarcts. Old small bifrontal infarcts. No midline shift, mass effect or masses. Mild prominence of ventricles sulci for patient's age. VASCULAR: Normal major intracranial vascular flow voids present at skull base. SKULL AND UPPER CERVICAL SPINE: No abnormal sellar expansion. No suspicious calvarial bone marrow signal. Craniocervical junction maintained. SINUSES/ORBITS: The mastoid air-cells and included paranasal sinuses are well-aerated. The included ocular globes and orbital contents are  non-suspicious. OTHER: None. MRA HEAD FINDINGS ANTERIOR CIRCULATION: Normal flow related enhancement of the included cervical, petrous, cavernous and supraclinoid internal carotid arteries. Patent anterior communicating artery. Mild stenosis distal RIGHT A1 segment. Patent anterior and middle cerebral arteries, including distal segments. No large vessel occlusion, high-grade stenosis, aneurysm. POSTERIOR CIRCULATION: RIGHT vertebral artery is dominant. Flow limiting stenosis LEFT vertebral artery. Basilar artery is patent, with normal flow related enhancement of the main branch vessels. Patent posterior cerebral arteries. Robust RIGHT posterior communicating artery, small on the LEFT. No large vessel occlusion, aneurysm. ANATOMIC VARIANTS: Azygos ACA. Source images and MIP images were reviewed. IMPRESSION: MRI head: 1. Acute small RIGHT frontal lobe/MCA territory infarct. 2. Multifocal old bilateral frontoparietal/MCA territory infarcts. Multiple old cerebellar infarcts. 3. Mild chronic small vessel ischemic disease. 4. Mild parenchymal brain volume loss for age. MRA head: 1. No acute large vessel occlusion. 2. Flow-limiting stenosis LEFT vertebral artery. Moderate stenosis RIGHT A1 segment. 3. Given numerous intracranial infarctions, recommend CT angiogram head and neck. Electronically Signed   By: Awilda Metro M.D.   On: 08/07/2017 21:14   US Carotid Bilateral (at [facility] And [facility] Only)  Result Date: 08/08/2017 CLINICAL DATA:  Slurred speech for the past day. History of smoking. EXAM: BILATERAL CAROTID DUPLEX ULTRASOUND TECHNIQUE: Wallace Cullens scale imaging, color Doppler and duplex ultrasound were performed of bilateral carotid and vertebral arteries in the neck. COMPARISON:  None. FINDINGS: Criteria: Quantification of carotid stenosis is based on velocity parameters that correlate the residual internal carotid diameter with NASCET-based stenosis levels, using the diameter of the distal internal carotid lumen as the  denominator for stenosis measurement. The following velocity measurements were obtained: RIGHT ICA:  99/25 cm/sec CCA:  115/28 cm/sec SYSTOLIC ICA/CCA RATIO:  0.9 DIASTOLIC ICA/CCA RATIO:  0.9 ECA:  107 cm/sec LEFT ICA:  73/31 cm/sec CCA:  171/43 cm/sec SYSTOLIC ICA/CCA RATIO:  0.4 DIASTOLIC ICA/CCA RATIO:  0.7 ECA:  66 cm/sec RIGHT CAROTID ARTERY: There is no grayscale evidence of significant intimal thickening or atherosclerotic plaque affecting interrogated portions of the right carotid system. There are no elevated peak systolic velocities within the interrogated course of the right internal carotid artery to suggest a hemodynamically significant stenosis. RIGHT VERTEBRAL ARTERY:  Antegrade flow LEFT CAROTID ARTERY: There is no grayscale evidence significant intimal thickening or atherosclerotic plaque affecting interrogated portions of the left carotid system. There are no elevated peak systolic velocities within the interrogated course the left internal carotid artery to suggest a hemodynamically significant stenosis. LEFT VERTEBRAL ARTERY:  Antegrade flow IMPRESSION: Normal carotid Doppler ultrasound. Electronically Signed   By: Simonne Come M.D.   On: 08/08/2017 10:06   Scheduled Meds: .  stroke: mapping our early stages of recovery book   Does not apply Once  . aspirin  300 mg Rectal Daily   Or  . aspirin  325 mg Oral Daily  . nicotine  21 mg Transdermal Daily   Continuous Infusions: . sodium chloride 70 mL/hr at 08/08/17 0800   Principal Problem:   Slurred speech Active Problems:   Renal insufficiency, mild   Abnormal CT of brain  Time spent:   Standley Dakins, MD, FAAFP [facility] Pager [phone]  If 7PM-7AM, please contact night-coverage [email or web address] Password [email or web address] 08/08/2017, 11:39 AM    LOS: 0 days

## 2017-08-09 LAB — BASIC METABOLIC PANEL
ANION GAP: 5 (ref 5–15)
BUN: 19 mg/dL (ref 6–20)
CALCIUM: 8.3 mg/dL — AB (ref 8.9–10.3)
CO2: 29 mmol/L (ref 22–32)
Chloride: 107 mmol/L (ref 101–111)
Creatinine, Ser: 1.25 mg/dL — ABNORMAL HIGH (ref 0.61–1.24)
GLUCOSE: 92 mg/dL (ref 65–99)
POTASSIUM: 4.3 mmol/L (ref 3.5–5.1)
Sodium: 141 mmol/L (ref 135–145)

## 2017-08-09 MED ORDER — SODIUM CHLORIDE 0.9 % IV SOLN
INTRAVENOUS | Status: DC
  Administered 2017-08-09: 10:00:00 via INTRAVENOUS

## 2017-08-09 MED ORDER — PANTOPRAZOLE SODIUM 40 MG PO TBEC
40.0000 mg | DELAYED_RELEASE_TABLET | Freq: Every day | ORAL | Status: DC
  Administered 2017-08-09 – 2017-08-12 (×4): 40 mg via ORAL
  Filled 2017-08-09 (×4): qty 1

## 2017-08-09 NOTE — Progress Notes (Addendum)
PROGRESS NOTE   LYNDOL VANDERHEIDEN  ZOX:[id number]  DOB: [birth year]  DOA: 08/07/2017 PCP: Toma Deiters, MD  Brief Admission Hx: CHORD TAKAHASHI is a 61 y.o. male with medical history significant of depression only comes in with slurred speech all day which started around 3am this am (18 hours ago).  He was admitted with acute CVA.    MDM/Assessment & Plan:   1. Acute right frontal lobe / MCA territory infarct - Admitted to telemetry, neurologist consulted, aspirin for antiplatelet therapy, he has multiple old cerebellar infarcts - radiology recommending CTA head/neck which has been ordered. It showed patent cervical carotid and vertebral arteries, moderate left V4 stenosis. Echo completed: EF 60-65% grade 1 DD, LDL elevated at 137, A1c 5.0%, carotids.  PT eval: no PT follow up. Neuro to see 10/1. Awaiting SLP evaluation.  2. Acute Renal insufficiency AKI - Gentle hydration ordered and repeat testing with improved creatinine.  3. Hyperlipidemia - started atorvastatin 40 mg daily.  4. Depression - resumed home medications.   5. Hypertension - allow permissive hypertension.    DVT prophylaxis: SCDs Code Status: FULL  Family Communication: not present at bedside Disposition Plan: Possible DC home 10/1   Subjective: Pt reports that he still is having speech deficits.    Objective: Vitals:   08/08/17 1800 08/08/17 2200 08/09/17 0206 08/09/17 0603  BP: 122/74 119/86 97/82 113/70  Pulse: 82 71 65 61  Resp: 16  17 18   Temp: 98.2 F (36.8 C) 97.8 F (36.6 C) (!) 97.5 F (36.4 C) (!) 97.3 F (36.3 C)  TempSrc: Oral Oral Oral Oral  SpO2: 98% 98% 99% 98%  Weight:      Height:        Intake/Output Summary (Last 24 hours) at 08/09/17 1016 Last data filed at 08/09/17 0900  Gross per 24 hour  Intake             2050 ml  Output                0 ml  Net             2050 ml   Filed Weights   08/07/17 1749 08/07/17 2219  Weight: 68 kg (150 lb) 70.3 kg (154 lb 14.4 oz)   REVIEW OF  SYSTEMS  As per history otherwise all reviewed and reported negative  Exam:  General exam: thin male, well developed, NAD. Slightly slurred speech noted.  Respiratory system:  No increased work of breathing. Cardiovascular system: S1 & S2 heard. No JVD, murmurs, gallops, clicks or pedal edema. Gastrointestinal system: Abdomen is nondistended, soft and nontender. Normal bowel sounds heard. Central nervous system: Alert and oriented. No speech deficits. Extremities: no CCE.  Data Reviewed: Basic Metabolic Panel:  Recent Labs Lab 08/07/17 1827 08/08/17 0611 08/09/17 0453  NA 141 138 141  K 3.8 3.8 4.3  CL 101 100* 107  CO2 29 27 29   GLUCOSE 104* 82 92  BUN 16 18 19   CREATININE 1.28* 1.29* 1.25*  CALCIUM 9.4 8.8* 8.3*   Liver Function Tests:  Recent Labs Lab 08/08/17 0611  AST 21  ALT 17  ALKPHOS 83  BILITOT 1.0  PROT 6.8  ALBUMIN 3.7   No results for input(s): LIPASE, AMYLASE in the last 168 hours. No results for input(s): AMMONIA in the last 168 hours. CBC:  Recent Labs Lab 08/07/17 1827 08/08/17 0611  WBC 5.2 5.7  NEUTROABS 3.2  --   HGB 15.5 13.5  HCT  45.7 40.3  MCV 93.6 94.2  PLT 257 238   Cardiac Enzymes: No results for input(s): CKTOTAL, CKMB, CKMBINDEX, TROPONINI in the last 168 hours. CBG (last 3)   Recent Labs  08/07/17 1754  GLUCAP 113*   No results found for this or any previous visit (from the past 240 hour(s)).   Studies: Ct Angio Head W Or Wo Contrast  Result Date: 08/08/2017 CLINICAL DATA:  Headache and slurred speech. Acute right MCA infarct on MRI. EXAM: CT ANGIOGRAPHY HEAD AND NECK TECHNIQUE: Multidetector CT imaging of the head and neck was performed using the standard protocol during bolus administration of intravenous contrast. Multiplanar CT image reconstructions and MIPs were obtained to evaluate the vascular anatomy. Carotid stenosis measurements (when applicable) are obtained utilizing NASCET criteria, using the distal  internal carotid diameter as the denominator. CONTRAST:  75 mL Isovue 370 COMPARISON:  Carotid Doppler ultrasound 08/08/2017. Head MRI/ MRA 08/07/2017. FINDINGS: CTA NECK FINDINGS Aortic arch: Normal variant 4 vessel aortic arch with the left vertebral artery arising directly from the arch. Widely patent brachiocephalic and subclavian arteries. Mild non stenotic soft plaque in the proximal left subclavian artery. Right carotid system: Patent without evidence of stenosis or dissection. Left carotid system: Patent without evidence of stenosis or dissection. Vertebral arteries: Patent without evidence of stenosis or dissection. Mildly dominant right vertebral artery. Skeleton: No acute osseous abnormality or suspicious osseous lesion. Mild-to-moderate cervical disc degeneration. Other neck: No mass or lymph node enlargement. Upper chest: Unremarkable. Review of the MIP images confirms the above findings CTA HEAD FINDINGS Anterior circulation: The internal carotid arteries are widely patent from skullbase to carotid termini. The ACAs and MCAs are patent without evidence of proximal branch occlusion or significant stenosis. The left ACA is dominant, and there is an azygos A2 configuration. No aneurysm. Posterior circulation: The intracranial vertebral arteries are patent to the basilar. There is a moderate left V4 stenosis just proximal to the PICA origin, and the more distal left vertebral artery is hypoplastic in appearance. Patent PICA and SCA origins are identified bilaterally. The basilar artery and is widely patent. There are large right and small left posterior communicating arteries with a hypoplastic right P1 segment noted. PCAs are patent without evidence of significant stenosis. No aneurysm. Venous sinuses: Patent. Anatomic variants: Fetal type origin of the right PCA. Dominant left ACA. Delayed phase: No abnormal enhancement. Review of the MIP images confirms the above findings IMPRESSION: 1. Widely patent  cervical carotid and vertebral arteries. 2. Moderate left V4 stenosis. 3. No significant anterior circulation stenosis. Electronically Signed   By: Sebastian Ache M.D.   On: 08/08/2017 11:06   Ct Head Wo Contrast  Result Date: 08/07/2017 CLINICAL DATA:  Headache, slurred speech. Last seen normal yesterday. EXAM: CT HEAD WITHOUT CONTRAST TECHNIQUE: Contiguous axial images were obtained from the base of the skull through the vertex without intravenous contrast. COMPARISON:  Sinus CT December 01, 2003 FINDINGS: BRAIN: No intraparenchymal hemorrhage, mass effect nor midline shift. Old cerebellar infarcts including RIGHT superior cerebellum. Old LEFT frontal and biparietal lobe infarcts. Hypodense RIGHT parietal lobe anterior to area of encephalomalacia. Mild ex vacuo dilatation LEFT occipital horn. No hydrocephalus. No abnormal extra-axial fluid collections. Basal cisterns are patent. VASCULAR: Mild dense intracranial vessels. SKULL: No skull fracture. No significant scalp soft tissue swelling. SINUSES/ORBITS: The mastoid air-cells and included paranasal sinuses are well-aerated.The included ocular globes and orbital contents are non-suspicious. OTHER: None. IMPRESSION: 1. Old bilateral MCA territory infarcts and multiple old cerebellar infarcts,  potentially acute component RIGHT parietal lobe. MRI would be more sensitive. 2. Mildly dense intracranial vessels seen with hemoconcentration. Electronically Signed   By: Awilda Metro M.D.   On: 08/07/2017 19:13   Ct Angio Neck W Or Wo Contrast  Result Date: 08/08/2017 CLINICAL DATA:  Headache and slurred speech. Acute right MCA infarct on MRI. EXAM: CT ANGIOGRAPHY HEAD AND NECK TECHNIQUE: Multidetector CT imaging of the head and neck was performed using the standard protocol during bolus administration of intravenous contrast. Multiplanar CT image reconstructions and MIPs were obtained to evaluate the vascular anatomy. Carotid stenosis measurements (when applicable)  are obtained utilizing NASCET criteria, using the distal internal carotid diameter as the denominator. CONTRAST:  75 mL Isovue 370 COMPARISON:  Carotid Doppler ultrasound 08/08/2017. Head MRI/ MRA 08/07/2017. FINDINGS: CTA NECK FINDINGS Aortic arch: Normal variant 4 vessel aortic arch with the left vertebral artery arising directly from the arch. Widely patent brachiocephalic and subclavian arteries. Mild non stenotic soft plaque in the proximal left subclavian artery. Right carotid system: Patent without evidence of stenosis or dissection. Left carotid system: Patent without evidence of stenosis or dissection. Vertebral arteries: Patent without evidence of stenosis or dissection. Mildly dominant right vertebral artery. Skeleton: No acute osseous abnormality or suspicious osseous lesion. Mild-to-moderate cervical disc degeneration. Other neck: No mass or lymph node enlargement. Upper chest: Unremarkable. Review of the MIP images confirms the above findings CTA HEAD FINDINGS Anterior circulation: The internal carotid arteries are widely patent from skullbase to carotid termini. The ACAs and MCAs are patent without evidence of proximal branch occlusion or significant stenosis. The left ACA is dominant, and there is an azygos A2 configuration. No aneurysm. Posterior circulation: The intracranial vertebral arteries are patent to the basilar. There is a moderate left V4 stenosis just proximal to the PICA origin, and the more distal left vertebral artery is hypoplastic in appearance. Patent PICA and SCA origins are identified bilaterally. The basilar artery and is widely patent. There are large right and small left posterior communicating arteries with a hypoplastic right P1 segment noted. PCAs are patent without evidence of significant stenosis. No aneurysm. Venous sinuses: Patent. Anatomic variants: Fetal type origin of the right PCA. Dominant left ACA. Delayed phase: No abnormal enhancement. Review of the MIP images  confirms the above findings IMPRESSION: 1. Widely patent cervical carotid and vertebral arteries. 2. Moderate left V4 stenosis. 3. No significant anterior circulation stenosis. Electronically Signed   By: Sebastian Ache M.D.   On: 08/08/2017 11:06   Mr Maxine Glenn Head Wo Contrast  Result Date: 08/07/2017 CLINICAL DATA:  Headache and slurred speech. Follow-up abnormal head CT. EXAM: MRI HEAD WITHOUT CONTRAST MRA HEAD WITHOUT CONTRAST TECHNIQUE: Multiplanar, multiecho pulse sequences of the brain and surrounding structures were obtained without intravenous contrast. Angiographic images of the head were obtained using MRA technique without contrast. COMPARISON:  CT HEAD August 07, 2017 at 1858 hours FINDINGS: MRI HEAD FINDINGS- mildly motion degraded examination. BRAIN: 1.6 x 2.2 cm reduced diffusion RIGHT posterior frontal lobe with low ADC values. Punctate focus of reduced diffusion LEFT parietal lobe with low ADC value. Faint susceptibility artifact within biparietal encephalomalacia compatible with mineralization. Old bilateral cerebellar infarcts. Old small bifrontal infarcts. No midline shift, mass effect or masses. Mild prominence of ventricles sulci for patient's age. VASCULAR: Normal major intracranial vascular flow voids present at skull base. SKULL AND UPPER CERVICAL SPINE: No abnormal sellar expansion. No suspicious calvarial bone marrow signal. Craniocervical junction maintained. SINUSES/ORBITS: The mastoid air-cells and included paranasal  sinuses are well-aerated. The included ocular globes and orbital contents are non-suspicious. OTHER: None. MRA HEAD FINDINGS ANTERIOR CIRCULATION: Normal flow related enhancement of the included cervical, petrous, cavernous and supraclinoid internal carotid arteries. Patent anterior communicating artery. Mild stenosis distal RIGHT A1 segment. Patent anterior and middle cerebral arteries, including distal segments. No large vessel occlusion, high-grade stenosis, aneurysm.  POSTERIOR CIRCULATION: RIGHT vertebral artery is dominant. Flow limiting stenosis LEFT vertebral artery. Basilar artery is patent, with normal flow related enhancement of the main branch vessels. Patent posterior cerebral arteries. Robust RIGHT posterior communicating artery, small on the LEFT. No large vessel occlusion, aneurysm. ANATOMIC VARIANTS: Azygos ACA. Source images and MIP images were reviewed. IMPRESSION: MRI head: 1. Acute small RIGHT frontal lobe/MCA territory infarct. 2. Multifocal old bilateral frontoparietal/MCA territory infarcts. Multiple old cerebellar infarcts. 3. Mild chronic small vessel ischemic disease. 4. Mild parenchymal brain volume loss for age. MRA head: 1. No acute large vessel occlusion. 2. Flow-limiting stenosis LEFT vertebral artery. Moderate stenosis RIGHT A1 segment. 3. Given numerous intracranial infarctions, recommend CT angiogram head and neck. Electronically Signed   By: Awilda Metro M.D.   On: 08/07/2017 21:14   Mr Brain Wo Contrast  Result Date: 08/07/2017 CLINICAL DATA:  Headache and slurred speech. Follow-up abnormal head CT. EXAM: MRI HEAD WITHOUT CONTRAST MRA HEAD WITHOUT CONTRAST TECHNIQUE: Multiplanar, multiecho pulse sequences of the brain and surrounding structures were obtained without intravenous contrast. Angiographic images of the head were obtained using MRA technique without contrast. COMPARISON:  CT HEAD August 07, 2017 at 1858 hours FINDINGS: MRI HEAD FINDINGS- mildly motion degraded examination. BRAIN: 1.6 x 2.2 cm reduced diffusion RIGHT posterior frontal lobe with low ADC values. Punctate focus of reduced diffusion LEFT parietal lobe with low ADC value. Faint susceptibility artifact within biparietal encephalomalacia compatible with mineralization. Old bilateral cerebellar infarcts. Old small bifrontal infarcts. No midline shift, mass effect or masses. Mild prominence of ventricles sulci for patient's age. VASCULAR: Normal major intracranial  vascular flow voids present at skull base. SKULL AND UPPER CERVICAL SPINE: No abnormal sellar expansion. No suspicious calvarial bone marrow signal. Craniocervical junction maintained. SINUSES/ORBITS: The mastoid air-cells and included paranasal sinuses are well-aerated. The included ocular globes and orbital contents are non-suspicious. OTHER: None. MRA HEAD FINDINGS ANTERIOR CIRCULATION: Normal flow related enhancement of the included cervical, petrous, cavernous and supraclinoid internal carotid arteries. Patent anterior communicating artery. Mild stenosis distal RIGHT A1 segment. Patent anterior and middle cerebral arteries, including distal segments. No large vessel occlusion, high-grade stenosis, aneurysm. POSTERIOR CIRCULATION: RIGHT vertebral artery is dominant. Flow limiting stenosis LEFT vertebral artery. Basilar artery is patent, with normal flow related enhancement of the main branch vessels. Patent posterior cerebral arteries. Robust RIGHT posterior communicating artery, small on the LEFT. No large vessel occlusion, aneurysm. ANATOMIC VARIANTS: Azygos ACA. Source images and MIP images were reviewed. IMPRESSION: MRI head: 1. Acute small RIGHT frontal lobe/MCA territory infarct. 2. Multifocal old bilateral frontoparietal/MCA territory infarcts. Multiple old cerebellar infarcts. 3. Mild chronic small vessel ischemic disease. 4. Mild parenchymal brain volume loss for age. MRA head: 1. No acute large vessel occlusion. 2. Flow-limiting stenosis LEFT vertebral artery. Moderate stenosis RIGHT A1 segment. 3. Given numerous intracranial infarctions, recommend CT angiogram head and neck. Electronically Signed   By: Awilda Metro M.D.   On: 08/07/2017 21:14   US Carotid Bilateral (at [facility] And [facility] Only)  Result Date: 08/08/2017 CLINICAL DATA:  Slurred speech for the past day. History of smoking. EXAM: BILATERAL CAROTID DUPLEX ULTRASOUND TECHNIQUE: Wallace Cullens  scale imaging, color Doppler and duplex ultrasound were  performed of bilateral carotid and vertebral arteries in the neck. COMPARISON:  None. FINDINGS: Criteria: Quantification of carotid stenosis is based on velocity parameters that correlate the residual internal carotid diameter with NASCET-based stenosis levels, using the diameter of the distal internal carotid lumen as the denominator for stenosis measurement. The following velocity measurements were obtained: RIGHT ICA:  99/25 cm/sec CCA:  115/28 cm/sec SYSTOLIC ICA/CCA RATIO:  0.9 DIASTOLIC ICA/CCA RATIO:  0.9 ECA:  107 cm/sec LEFT ICA:  73/31 cm/sec CCA:  171/43 cm/sec SYSTOLIC ICA/CCA RATIO:  0.4 DIASTOLIC ICA/CCA RATIO:  0.7 ECA:  66 cm/sec RIGHT CAROTID ARTERY: There is no grayscale evidence of significant intimal thickening or atherosclerotic plaque affecting interrogated portions of the right carotid system. There are no elevated peak systolic velocities within the interrogated course of the right internal carotid artery to suggest a hemodynamically significant stenosis. RIGHT VERTEBRAL ARTERY:  Antegrade flow LEFT CAROTID ARTERY: There is no grayscale evidence significant intimal thickening or atherosclerotic plaque affecting interrogated portions of the left carotid system. There are no elevated peak systolic velocities within the interrogated course the left internal carotid artery to suggest a hemodynamically significant stenosis. LEFT VERTEBRAL ARTERY:  Antegrade flow IMPRESSION: Normal carotid Doppler ultrasound. Electronically Signed   By: Simonne Come M.D.   On: 08/08/2017 10:06   Scheduled Meds: .  stroke: mapping our early stages of recovery book   Does not apply Once  . aspirin  300 mg Rectal Daily   Or  . aspirin  325 mg Oral Daily  . atorvastatin  40 mg Oral q1800  . escitalopram  20 mg Oral Daily  . mirtazapine  45 mg Oral QHS  . nicotine  21 mg Transdermal Daily  . pantoprazole  40 mg Oral Q0600   Continuous Infusions: . sodium chloride     Principal Problem:   Acute CVA  (cerebrovascular accident) [facility]) Active Problems:   Slurred speech   Renal insufficiency, mild   Abnormal CT of brain   Depression   Stroke [facility])  Time spent:   Standley Dakins, MD, FAAFP [facility] Pager [phone]  If 7PM-7AM, please contact night-coverage [email or web address] Password [email or web address] 08/09/2017, 10:16 AM    LOS: 1 day

## 2017-08-10 LAB — URINALYSIS, ROUTINE W REFLEX MICROSCOPIC
BILIRUBIN URINE: NEGATIVE
Glucose, UA: NEGATIVE mg/dL
HGB URINE DIPSTICK: NEGATIVE
KETONES UR: NEGATIVE mg/dL
Leukocytes, UA: NEGATIVE
NITRITE: NEGATIVE
PROTEIN: NEGATIVE mg/dL
Specific Gravity, Urine: 1.017 (ref 1.005–1.030)
pH: 5 (ref 5.0–8.0)

## 2017-08-10 LAB — SEDIMENTATION RATE: Sed Rate: 16 mm/hr (ref 0–16)

## 2017-08-10 LAB — RAPID URINE DRUG SCREEN, HOSP PERFORMED
Amphetamines: NOT DETECTED
Barbiturates: NOT DETECTED
Benzodiazepines: POSITIVE — AB
COCAINE: NOT DETECTED
OPIATES: NOT DETECTED
Tetrahydrocannabinol: NOT DETECTED

## 2017-08-10 LAB — TSH: TSH: 1.786 u[IU]/mL (ref 0.350–4.500)

## 2017-08-10 NOTE — Progress Notes (Signed)
Patient pulled IV out, does not want Iv replaced.  Order obtained from MD to d/c fluids.  Patient suppose to be discharged later today.

## 2017-08-10 NOTE — Progress Notes (Signed)
PROGRESS NOTE   Charles Crawford  QIO:[id number]  DOB: 1956/08/22  DOA: 08/07/2017 PCP: Toma Deiters, MD  Brief Admission Hx: Charles Crawford is a 61 y.o. male with medical history significant of depression only comes in with slurred speech all day which started around 3am this am (18 hours ago).  He was admitted with acute CVA.    MDM/Assessment & Plan:   1. Acute right frontal lobe / MCA territory infarct - Admitted to telemetry, neurologist consulted, aspirin for antiplatelet therapy, he has multiple old cerebellar infarcts - radiology recommending CTA head/neck which has been ordered. It showed patent cervical carotid and vertebral arteries, moderate left V4 stenosis. Echo completed: EF 60-65% grade 1 DD, LDL elevated at 137, A1c 5.0%, carotids.  PT eval: no PT follow up. Neuro to see 10/1. Awaiting SLP evaluation.  2. Acute Renal insufficiency AKI - Gentle hydration ordered and repeat testing with improved creatinine.  3. Hyperlipidemia - started atorvastatin 40 mg daily.  4. Depression - resumed home medications.      DVT prophylaxis: SCDs Code Status: FULL  Family Communication: not present at bedside Disposition Plan: Possible DC home 10/1   Subjective: Pt reports is still is having speech deficits not at his baseline but improving.    Objective: Vitals:   08/09/17 1800 08/09/17 2200 08/10/17 0200 08/10/17 0900  BP: 112/64 115/73 (!) 93/48 125/83  Pulse: 75 78 69 80  Resp:  Temp: 98 F (36.7 C) 98.6 F (37 C) 98.4 F (36.9 C) 98.6 F (37 C)  TempSrc: Oral Oral Oral Oral  SpO2: 97% 99% 99% 97%  Weight:      Height:        Intake/Output Summary (Last 24 hours) at 08/10/17 1002 Last data filed at 08/09/17 1953  Gross per 24 hour  Intake           474.17 ml  Output              100 ml  Net           374.17 ml   Filed Weights   08/07/17 1749 08/07/17 2219  Weight: 68 kg (150 lb) 70.3 kg (154 lb 14.4 oz)   REVIEW OF SYSTEMS  As per history otherwise  all reviewed and reported negative  Exam:  General exam: thin male, well developed, NAD. Slightly slurred speech noted.  Respiratory system:  No increased work of breathing. Cardiovascular system: S1 & S2 heard. No JVD, murmurs, gallops, clicks or pedal edema. Gastrointestinal system: Abdomen is nondistended, soft and nontender. Normal bowel sounds heard. Central nervous system: Alert and oriented. No speech deficits. Extremities: no CCE.  Data Reviewed: Basic Metabolic Panel:  Recent Labs Lab 08/07/17 1827 08/08/17 0611 08/09/17 0453  NA 141 138 141  K 3.8 3.8 4.3  CL 101 100* 107  CO2 GLUCOSE 104* 82 92  BUN CREATININE 1.28* 1.29* 1.25*  CALCIUM 9.4 8.8* 8.3*   Liver Function Tests:  Recent Labs Lab 08/08/17 0611  AST 21  ALT 17  ALKPHOS 83  BILITOT 1.0  PROT 6.8  ALBUMIN 3.7   No results for input(s): LIPASE, AMYLASE in the last 168 hours. No results for input(s): AMMONIA in the last 168 hours. CBC:  Recent Labs Lab 08/07/17 1827 08/08/17 0611  WBC 5.2 5.7  NEUTROABS 3.2  --   HGB 15.5 13.5  HCT 45.7 40.3  MCV 93.6 94.2  PLT  257 238   Cardiac Enzymes: No results for input(s): CKTOTAL, CKMB, CKMBINDEX, TROPONINI in the last 168 hours. CBG (last 3)   Recent Labs  08/07/17 1754  GLUCAP 113*   No results found for this or any previous visit (from the past 240 hour(s)).   Studies: No results found. Scheduled Meds: . aspirin  300 mg Rectal Daily   Or  . aspirin  325 mg Oral Daily  . atorvastatin  40 mg Oral q1800  . escitalopram  20 mg Oral Daily  . mirtazapine  45 mg Oral QHS  . nicotine  21 mg Transdermal Daily  . pantoprazole  40 mg Oral Q0600   Continuous Infusions:  Principal Problem:   Acute CVA (cerebrovascular accident) [facility]) Active Problems:   Slurred speech   Renal insufficiency, mild   Abnormal CT of brain   Depression   Stroke [facility])  Time spent:   Standley Dakins, MD, FAAFP [facility] Pager [phone]  If 7PM-7AM, please contact night-coverage [email or web address] Password [email or web address] 08/10/2017, 10:02 AM    LOS: 2 days

## 2017-08-10 NOTE — Evaluation (Signed)
Speech Language Pathology Evaluation Patient Details Name: Charles Crawford MRN: [id number] DOB: 12-15-1955 Today's Date: 08/10/2017 Time: 8295-6213 SLP Time Calculation (min) (ACUTE ONLY): 33 min  Problem List:  Patient Active Problem List   Diagnosis Date Noted  . Acute CVA (cerebrovascular accident) (HCC) 08/08/2017  . Depression 08/08/2017  . Stroke (HCC) 08/08/2017  . Slurred speech 08/07/2017  . Renal insufficiency, mild 08/07/2017  . Abnormal CT of brain 08/07/2017   Past Medical History:  Past Medical History:  Diagnosis Date  . Depression    Past Surgical History:  Past Surgical History:  Procedure Laterality Date  . APPENDECTOMY    . BACK SURGERY     HPI:  Charles Crawford a 61 y.o.malewith medical history significant of depression only comes in with slurred speech all day which started around 3am this am (18 hours ago). He was admitted with acute CVA. MRI shows: Acute right frontal lobe / MCA territory infarct - Admitted to telemetry, neurologist consulted, aspirin for antiplatelet therapy, he has multiple old cerebellar infarcts - radiology recommending CTA head/neck which has been ordered. It showed patent cervical carotid and vertebral arteries, moderate left V4 stenosis. Echo completed: EF 60-65% grade 1 DD, LDL elevated at 137, A1c 5.0%, carotids. SLE ordered as part of stroke protocol.   Assessment / Plan / Recommendation Clinical Impression  SLE completed at bedside. Pt presents with mild dysarthria (suspect this has improved from moderate at admission) and other cognitive linguistic skills appear WNL. Oral motor examination reveals mild to mild/mod global buccal and lingual weakness without gross asymmetry. Pt denies dysphagia and was observed self presenting thin liquids during visit. Pt reports improved speech intelligibility since Friday. His deficits appear mild at this time and suspect continued improvement. Pt provided with compensatory strategies (over  articulate, pacing) for intelligibility and encouraged oral motor exercises with mirror for feedback. Pt given SLP contact information should he have further questions or concerns given that he does not have insurance and will likely discharge today or tomorrow.     SLP Assessment  SLP Recommendation/Assessment: Patient needs continued Speech Lanaguage Pathology Services (Pt does not have insurance; deficits are mild) SLP Visit Diagnosis: Dysarthria and anarthria (R47.1)    Follow Up Recommendations  None (Pt does not have insurance; mild dysarthria)    Frequency and Duration  (Pt likely to d/c today)         SLP Evaluation Cognition  Overall Cognitive Status: Within Functional Limits for tasks assessed Arousal/Alertness: Awake/alert Orientation Level: Oriented X4 Memory: Impaired Memory Impairment: Retrieval deficit Awareness: Appears intact Problem Solving: Appears intact Safety/Judgment: Appears intact       Comprehension  Auditory Comprehension Overall Auditory Comprehension: Appears within functional limits for tasks assessed Yes/No Questions: Within Functional Limits Commands: Within Functional Limits Conversation: Complex Visual Recognition/Discrimination Discrimination: Within Function Limits Reading Comprehension Reading Status: Within funtional limits    Expression Expression Primary Mode of Expression: Verbal Verbal Expression Overall Verbal Expression: Appears within functional limits for tasks assessed Initiation: No impairment Automatic Speech: Name;Social Response;Day of week Level of Generative/Spontaneous Verbalization: Conversation Repetition: No impairment Naming: No impairment Pragmatics: No impairment (Pt is very talkative) Interfering Components: Speech intelligibility Non-Verbal Means of Communication: Not applicable Written Expression Dominant Hand: Right Written Expression: Not tested   Oral / Motor  Oral Motor/Sensory Function Overall  Oral Motor/Sensory Function: Mild impairment Facial ROM: Reduced right;Reduced left Facial Symmetry: Within Functional Limits Facial Strength: Reduced right;Reduced left Facial Sensation: Within Functional Limits Lingual ROM: Reduced right;Reduced left;Suspected  CN XII (hypoglossal) dysfunction Lingual Symmetry: Within Functional Limits Lingual Strength: Reduced;Suspected CN XII (hypoglossal) dysfunction Lingual Sensation: Within Functional Limits Velum: Within Functional Limits Mandible: Within Functional Limits Motor Speech Overall Motor Speech: Impaired Respiration: Within functional limits Phonation: Normal Resonance: Within functional limits Articulation: Impaired Level of Impairment: Word Intelligibility: Intelligibility reduced Word: 75-100% accurate Phrase: 75-100% accurate Sentence: 75-100% accurate Conversation: 75-100% accurate Motor Planning: Witnin functional limits Motor Speech Errors: Aware Interfering Components: Inadequate dentition (Pt does not have upper dentures present) Effective Techniques: Slow rate;Over-articulate;Pause   Thank you,  Havery Moros, CCC-SLP [phone]                     Mubarak Bevens 08/10/2017, 11:58 AM

## 2017-08-10 NOTE — Consult Note (Addendum)
Crawford A. Merlene Laughter, MD     [email or web address]          Charles Crawford is an 61 y.o. male.   ASSESSMENT/PLAN: 1. Acute right cortical temporal infarct in the setting of multiple prior large vessel cortical infarcts involving both hemispheric: The picture is highly suspicious for cardioembolic strokes.  We will obtain a TEE and loop recorder.  The patient will be maintained on aspirin for now.  He also will be maintained on statin medications. Additional labs will be obtained to include RPR, HIV, C-reactive protein, vitamin B12 and sed rate.  2.  Remote history of alcoholism  The patient is 61 year old white male who presents with the acute onset of dysarthria.  He did have some chest discomfort on initial presentation associated with heartburn.  He does not report having focal numbness, weakness, dizziness or headaches.  Imaging showed evidence of multiple strokes in the past.  He does not recall having discrete specific symptoms of stroke other than probably acute sensation in the past have not been able to do something we briefly, word-finding problems or just not feeling right.  However, he denies any episodes of having focal numbness, weakness, acute severe dizziness or dysarthria.  He has a remote history of alcoholism.  He has been sober for about 20 years.  There is a family history of stroke in his mother who had multiple strokes.  The review of systems otherwise negative.      GENERAL:    This is a pleasant somewhat anxious male in no acute distress.  HEENT:   This is normal.  ABDOMEN: soft  EXTREMITIES: No edema   BACK:   This is normal.  SKIN: Normal by inspection.    MENTAL STATUS: Alert and oriented. Speech is mildly dysarthric; language and cognition are generally intact. Judgment and insight normal.   CRANIAL NERVES: Pupils are equal, round and reactive to light and accomodation; extra ocular movements are full, there is no significant nystagmus;  visual fields are full; upper and lower facial muscles are normal in strength and symmetric, there is no flattening of the nasolabial folds; tongue is midline; uvula is midline; shoulder elevation is normal.  Visual fields are intact.  MOTOR: Normal tone, bulk and strength; no pronator drift.  COORDINATION: Left finger to nose is normal, right finger to nose is normal, No rest tremor; no intention tremor; no postural tremor; no bradykinesia.  REFLEXES: Deep tendon reflexes are symmetrical and normal. Babinski reflexes are flexor bilaterally.   SENSATION: Normal to light touch, temperature, and pinprick.   The patient does not extinguish to double simultaneous stimulation.    NIH stroke scale 1.     Blood pressure (!) 117/91, pulse 86, temperature 98.7 F (37.1 C), resp. rate 16, height _0  (1.778 m), weight 154 lb 14.4 oz (70.3 kg), SpO2 98 %.  Past Medical History:  Diagnosis Date  . Depression     Past Surgical History:  Procedure Laterality Date  . APPENDECTOMY    . BACK SURGERY      History reviewed. No pertinent family history.  Social History:  reports that he has quit smoking. He has never used smokeless tobacco. He reports that he does not drink alcohol or use drugs.  Allergies: No Known Allergies  Medications: Prior to Admission medications   Medication Sig Start Date End Date Taking? Authorizing Provider  escitalopram (LEXAPRO) 20 MG tablet Take 20 mg by mouth daily.   Yes [provider]  mirtazapine (REMERON) 15 MG tablet Take 45 mg by mouth at bedtime.    Yes [provider]    Scheduled Meds: . aspirin  300 mg Rectal Daily   Or  . aspirin  325 mg Oral Daily  . atorvastatin  40 mg Oral q1800  . escitalopram  20 mg Oral Daily  . mirtazapine  45 mg Oral QHS  . nicotine  21 mg Transdermal Daily  . pantoprazole  40 mg Oral Q0600   Continuous Infusions: PRN Meds:.acetaminophen **OR** acetaminophen (TYLENOL) oral liquid 160 mg/5 mL  **OR** acetaminophen, ALPRAZolam     Results for orders placed or performed during the hospital encounter of 08/07/17 (from the past 48 hour(s))  Basic metabolic panel     Status: Abnormal   Collection Time: 08/09/17  4:53 AM  Result Value Ref Range   Sodium 141 135 - 145 mmol/L   Potassium 4.3 3.5 - 5.1 mmol/L   Chloride 107 101 - 111 mmol/L   CO2 29 22 - 32 mmol/L   Glucose, Bld 92 65 - 99 mg/dL   BUN 19 6 - 20 mg/dL   Creatinine, Ser 1.25 (H) 0.61 - 1.24 mg/dL   Calcium 8.3 (L) 8.9 - 10.3 mg/dL   GFR calc non Af Amer >60 >60 mL/min   GFR calc Af Amer >60 >60 mL/min    Comment: (NOTE) The eGFR has been calculated using the CKD EPI equation. This calculation has not been validated in all clinical situations. eGFR's persistently <60 mL/min signify possible Chronic Kidney Disease.    Anion gap 5 5 - 15    Studies/Results:    BRAIN MRI IMPRESSION: MRI head:  1. Acute small RIGHT frontal lobe/MCA territory infarct. 2. Multifocal old bilateral frontoparietal/MCA territory infarcts. Multiple old cerebellar infarcts. 3. Mild chronic small vessel ischemic disease. 4. Mild parenchymal brain volume loss for age. MRA head:  1. No acute large vessel occlusion. 2. Flow-limiting stenosis LEFT vertebral artery. Moderate stenosis RIGHT A1 segment. 3. Given numerous intracranial infarctions, recommend CT angiogram head and neck.      HEAD NECK CTA FINDINGS: CTA NECK FINDINGS  Aortic arch: Normal variant 4 vessel aortic arch with the left vertebral artery arising directly from the arch. Widely patent brachiocephalic and subclavian arteries. Mild non stenotic soft plaque in the proximal left subclavian artery.  Right carotid system: Patent without evidence of stenosis or dissection.  Left carotid system: Patent without evidence of stenosis or dissection.  Vertebral arteries: Patent without evidence of stenosis or dissection. Mildly dominant right vertebral  artery.  Skeleton: No acute osseous abnormality or suspicious osseous lesion. Mild-to-moderate cervical disc degeneration.  Other neck: No mass or lymph node enlargement.  Upper chest: Unremarkable.  Review of the MIP images confirms the above findings  CTA HEAD FINDINGS  Anterior circulation: The internal carotid arteries are widely patent from skullbase to carotid termini. The ACAs and MCAs are patent without evidence of proximal branch occlusion or significant stenosis. The left ACA is dominant, and there is an azygos A2 configuration. No aneurysm.  Posterior circulation: The intracranial vertebral arteries are patent to the basilar. There is a moderate left V4 stenosis just proximal to the PICA origin, and the more distal left vertebral artery is hypoplastic in appearance. Patent PICA and SCA origins are identified bilaterally. The basilar artery and is widely patent. There are large right and small left posterior communicating arteries with a hypoplastic right P1 segment noted. PCAs are patent without evidence of significant stenosis. No  aneurysm.  Venous sinuses: Patent.  Anatomic variants: Fetal type origin of the right PCA. Dominant left ACA.  Delayed phase: No abnormal enhancement.  Review of the MIP images confirms the above findings  IMPRESSION: 1. Widely patent cervical carotid and vertebral arteries. 2. Moderate left V4 stenosis. 3. No significant anterior circulation stenosis.      TTE - Left ventricle: The cavity size was normal. Wall thickness was   normal. Systolic function was normal. The estimated ejection   fraction was in the range of 60% to 65%. Wall motion was normal;   there were no regional wall motion abnormalities. Doppler   parameters are consistent with abnormal left ventricular   relaxation (grade 1 diastolic dysfunction). - Aortic valve: Valve area (VTI): 2.67 cm^2. Valve area (Vmax):   3.02 cm^2. Valve area (Vmean):  2.39 cm^2. - Technically adequate study.    CAROTID DOPPLERS normal       The brain MRI and MRA are reviewed in person. The MRI shows acute infarcts cortical base involving the right lateral temporal region. This is a moderate size infarct seen on about 5 cuts. There are a few other moderate to large size infarcts all cortical infarcts. They are seen involving the right cerebellum, left occipital parietal region approximating the posterior watershed area, right frontal region, into involving the left occipital regions. These are associated with reduced signal consistent with encephalomalacia and increased signal on FLAIR imaging. No large vessel occlusion are seen on MRA.   Charles Crawford, M.D.  Diplomate, Tax adviser of Psychiatry and Neurology ( Neurology). 08/10/2017, 6:42 PM

## 2017-08-10 NOTE — Care Management Note (Signed)
Case Management Note  Patient Details  Name: Charles Crawford MRN: [id number] Date of Birth: 08/30/1956  Subjective/Objective:  Adm with CVA, from home alone. No PT or SPT recommendations. Patient back to baseline physically. Patient does not have insurance, financial counselor consulted.                   Action/Plan: Anticipate DC home with self care. No CM needs.  Expected Discharge Date:  08/08/17               Expected Discharge Plan:  Home/Self Care  In-House Referral:  Financial Counselor  Discharge planning Services  CM Consult  Post Acute Care Choice:  NA Choice offered to:  NA  DME Arranged:    DME Agency:     HH Arranged:    HH Agency:     Status of Service:  Completed, signed off  If discussed at [employer] of Stay Meetings, dates discussed:    Additional Comments:  Rickard Kennerly, Chrystine Oiler, RN 08/10/2017, 2:12 PM

## 2017-08-10 NOTE — Progress Notes (Signed)
OT Screen Note  Patient Details Name: JHORDAN MCKIBBEN MRN: [id number] DOB: Jan 29, 1956   Cancelled Treatment:    Reason Eval/Treat Not Completed: OT screened, no needs identified, will sign off. OT orders received and patient's chart reviewed. Pt is functioning at baseline independent level and does not require any OT services at this time. Thank-you for the referral.   Limmie Patricia, OTR/L,CBIS  [phone]  08/10/2017, 8:14 AM

## 2017-08-11 ENCOUNTER — Other Ambulatory Visit (HOSPITAL_COMMUNITY): Payer: Self-pay | Admitting: *Deleted

## 2017-08-11 ENCOUNTER — Inpatient Hospital Stay (HOSPITAL_COMMUNITY): Payer: Self-pay

## 2017-08-11 ENCOUNTER — Encounter (HOSPITAL_COMMUNITY)
Admission: EM | Disposition: A | Discharge status: Home / Self Care | Payer: Self-pay | Source: Home / Self Care | Attending: Family Medicine

## 2017-08-11 ENCOUNTER — Encounter (HOSPITAL_COMMUNITY): Payer: Self-pay | Admitting: *Deleted

## 2017-08-11 DIAGNOSIS — I6389 Other cerebral infarction: Secondary | ICD-10-CM

## 2017-08-11 HISTORY — PX: TEE WITHOUT CARDIOVERSION: SHX5443

## 2017-08-11 LAB — VITAMIN B12: VITAMIN B 12: 187 pg/mL (ref 180–914)

## 2017-08-11 LAB — C-REACTIVE PROTEIN: CRP: 1.1 mg/dL — ABNORMAL HIGH (ref <1.0)

## 2017-08-11 SURGERY — ECHOCARDIOGRAM, TRANSESOPHAGEAL
Anesthesia: Moderate Sedation

## 2017-08-11 MED ORDER — SENNOSIDES-DOCUSATE SODIUM 8.6-50 MG PO TABS
1.0000 | ORAL_TABLET | Freq: Two times a day (BID) | ORAL | Status: DC
  Administered 2017-08-11 – 2017-08-12 (×3): 1 via ORAL
  Filled 2017-08-11 (×4): qty 1

## 2017-08-11 MED ORDER — SODIUM CHLORIDE BACTERIOSTATIC 0.9 % IJ SOLN
INTRAMUSCULAR | Status: AC
  Filled 2017-08-11: qty 20

## 2017-08-11 MED ORDER — MIDAZOLAM HCL 5 MG/5ML IJ SOLN
INTRAMUSCULAR | Status: AC
  Filled 2017-08-11: qty 10

## 2017-08-11 MED ORDER — MEPERIDINE HCL 50 MG/ML IJ SOLN
INTRAMUSCULAR | Status: DC | PRN
  Administered 2017-08-11 (×2): 25 mg via INTRAVENOUS

## 2017-08-11 MED ORDER — LIDOCAINE VISCOUS 2 % MT SOLN
OROMUCOSAL | Status: AC
  Filled 2017-08-11: qty 15

## 2017-08-11 MED ORDER — CYANOCOBALAMIN 1000 MCG/ML IJ SOLN
1000.0000 ug | Freq: Once | INTRAMUSCULAR | Status: AC
  Administered 2017-08-11: 1000 ug via INTRAMUSCULAR
  Filled 2017-08-11: qty 1

## 2017-08-11 MED ORDER — BUTAMBEN-TETRACAINE-BENZOCAINE 2-2-14 % EX AERO
INHALATION_SPRAY | CUTANEOUS | Status: AC
  Filled 2017-08-11: qty 5

## 2017-08-11 MED ORDER — BUTAMBEN-TETRACAINE-BENZOCAINE 2-2-14 % EX AERO
INHALATION_SPRAY | CUTANEOUS | Status: DC | PRN
  Administered 2017-08-11: 2 via TOPICAL

## 2017-08-11 MED ORDER — SODIUM CHLORIDE 0.9 % IV SOLN
INTRAVENOUS | Status: DC
  Administered 2017-08-11: 10:00:00 via INTRAVENOUS

## 2017-08-11 MED ORDER — ALPRAZOLAM 0.25 MG PO TABS
0.2500 mg | ORAL_TABLET | Freq: Four times a day (QID) | ORAL | Status: DC | PRN
  Administered 2017-08-11 – 2017-08-12 (×5): 0.25 mg via ORAL
  Filled 2017-08-11 (×6): qty 1

## 2017-08-11 MED ORDER — MIDAZOLAM HCL 5 MG/5ML IJ SOLN
INTRAMUSCULAR | Status: DC | PRN
  Administered 2017-08-11 (×2): 2 mg via INTRAVENOUS
  Administered 2017-08-11: 1 mg via INTRAVENOUS

## 2017-08-11 MED ORDER — MEPERIDINE HCL 50 MG/ML IJ SOLN
INTRAMUSCULAR | Status: AC
  Filled 2017-08-11: qty 2

## 2017-08-11 MED ORDER — LIDOCAINE VISCOUS 2 % MT SOLN
OROMUCOSAL | Status: DC | PRN
  Administered 2017-08-11: 5 mL via OROMUCOSAL

## 2017-08-11 NOTE — Progress Notes (Signed)
*  PRELIMINARY RESULTS* Echocardiogram Echocardiogram Transesophageal has been performed.  Jeryl Columbia 08/11/2017, 11:28 AM

## 2017-08-11 NOTE — Progress Notes (Signed)
PROGRESS NOTE   Charles Crawford  UEA:[id number]  DOB: 03-31-1956  DOA: 08/07/2017 PCP: Toma Deiters, MD  Brief Admission Hx: Charles Crawford is a 61 y.o. male with medical history significant of depression only comes in with slurred speech all day which started around 3am this am (18 hours ago).  He was admitted with acute CVA.    MDM/Assessment & Plan:   1. Acute right frontal lobe / MCA territory infarct - Admitted to telemetry, neurologist consulted, aspirin for antiplatelet therapy, he has multiple old cerebellar infarcts - radiology recommending CTA head/neck which has been ordered. It showed patent cervical carotid and vertebral arteries, moderate left V4 stenosis. Echo completed: EF 60-65% grade 1 DD, LDL elevated at 137, A1c 5.0%, carotids.  PT eval: no PT follow up. Neuro saw patient 10/1 and ordered TEE to look for cardioembolic sources, he is having TEE 10/2.  He likely will need an implanted loop recorder as well.    2. Acute Renal insufficiency AKI - Gentle hydration ordered and repeat testing with improved creatinine.  3. Hyperlipidemia - started atorvastatin 40 mg daily.  4. Depression - resumed home medications.      DVT prophylaxis: SCDs Code Status: FULL  Family Communication: daughter at bedside Disposition Plan: Home when ok with neurology  Subjective: Pt reports some speech improvements.  He has been ambulating very well.     Objective: Vitals:   08/10/17 2145 08/11/17 0534 08/11/17 0922 08/11/17 1000  BP: 131/85 115/65 (!) 142/91 (!) 131/93  Pulse: 77 69 65 62  Resp: Temp: 97.7 F (36.5 C) 98.9 F (37.2 C) 97.8 F (36.6 C)   TempSrc: Oral Oral Oral   SpO2: 99% 98% 99% 100%  Weight:      Height:        Intake/Output Summary (Last 24 hours) at 08/11/17 1014 Last data filed at 08/11/17 0542  Gross per 24 hour  Intake              680 ml  Output              550 ml  Net              130 ml   Filed Weights   08/07/17 1749 08/07/17 2219    Weight: 68 kg (150 lb) 70.3 kg (154 lb 14.4 oz)   REVIEW OF SYSTEMS  As per history otherwise all reviewed and reported negative  Exam:  General exam: thin male, well developed, NAD. Slightly slurred speech noted but improved from prior exam.  Respiratory system:  No increased work of breathing. Cardiovascular system: S1 & S2 heard. No JVD, murmurs, gallops, clicks or pedal edema. Gastrointestinal system: Abdomen is nondistended, soft and nontender. Normal bowel sounds heard. Central nervous system: Alert and oriented. nonfocal. Extremities: no CCE.  Data Reviewed: Basic Metabolic Panel:  Recent Labs Lab 08/07/17 1827 08/08/17 0611 08/09/17 0453  NA 141 138 141  K 3.8 3.8 4.3  CL 101 100* 107  CO2 GLUCOSE 104* 82 92  BUN CREATININE 1.28* 1.29* 1.25*  CALCIUM 9.4 8.8* 8.3*   Liver Function Tests:  Recent Labs Lab 08/08/17 0611  AST 21  ALT 17  ALKPHOS 83  BILITOT 1.0  PROT 6.8  ALBUMIN 3.7   No results for input(s): LIPASE, AMYLASE in the last 168 hours. No results for input(s): AMMONIA in the last 168 hours. CBC:  Recent Labs  Lab 08/07/17 1827 08/08/17 0611  WBC 5.2 5.7  NEUTROABS 3.2  --   HGB 15.5 13.5  HCT 45.7 40.3  MCV 93.6 94.2  PLT 257 238   Cardiac Enzymes: No results for input(s): CKTOTAL, CKMB, CKMBINDEX, TROPONINI in the last 168 hours. CBG (last 3)  No results for input(s): GLUCAP in the last 72 hours. No results found for this or any previous visit (from the past 240 hour(s)).   Studies: No results found. Scheduled Meds: . butamben-tetracaine-benzocaine      . lidocaine      . meperidine      . midazolam      . sodium chloride bacteriostatic      . [MAR Hold] aspirin  300 mg Rectal Daily   Or  . [MAR Hold] aspirin  325 mg Oral Daily  . [MAR Hold] atorvastatin  40 mg Oral q1800  . [MAR Hold] escitalopram  20 mg Oral Daily  . [MAR Hold] mirtazapine  45 mg Oral QHS  . [MAR Hold] nicotine  21 mg Transdermal  Daily  . [MAR Hold] pantoprazole  40 mg Oral Q0600   Continuous Infusions: . sodium chloride 20 mL/hr at 08/11/17 1610   Principal Problem:   Acute CVA (cerebrovascular accident) [place]) Active Problems:   Slurred speech   Renal insufficiency, mild   Abnormal CT of brain   Depression   Stroke [facility])  Time spent:   Standley Dakins, MD, FAAFP [facility] Pager [phone]  If 7PM-7AM, please contact night-coverage [email or web address] Password [email or web address] 08/11/2017, 10:14 AM    LOS: 3 days

## 2017-08-11 NOTE — Progress Notes (Signed)
[facility] Charles Crawford A. Gerilyn Pilgrim, MD     [email or web address]          Charles Crawford is an 61 y.o. male.   ASSESSMENT/PLAN: 1. Acute right cortical temporal infarct in the setting of multiple prior large vessel cortical infarcts involving both hemispheric: The picture is highly suspicious for cardioembolic strokes.  The patient will be maintained on aspirin for now.  He also will be maintained on statin medications. Operation meds should be made for the patient to see Dr. Ladona Ridgel in the electrophysiology lab for loop recorder.  In the meantime, a 30 day event monitor is suggested.   The patient should follow up with me in the office about 4 weeks.  I did encourage the patient to discontinue or at least reduce nicotine use.   2.  Vitamin B12 deficiency: will be replaced.    3. Remote history of alcoholism    I had the very long discussion with the patient and his sister.  All questions were answered.  They appeared to be eager to find out what is the cause of his multiple strokes.  Tee failed to show structural abnormalities such as a PFO, ASD or valvular heart disease.  This means that atrial fibrillation is the most likely culprit.    GENERAL:    This is a pleasant somewhat anxious male in no acute distress.  HEENT:   This is normal.  ABDOMEN: soft  EXTREMITIES: No edema   BACK:   This is normal.  SKIN: Normal by inspection.    MENTAL STATUS: Alert and oriented. Speech is mildly dysarthric; language and cognition are generally intact. Judgment and insight normal.   CRANIAL NERVES: Pupils are equal, round and reactive to light and accomodation; extra ocular movements are full, there is no significant nystagmus; visual fields are full; upper and lower facial muscles are normal in strength and symmetric, there is no flattening of the nasolabial folds; tongue is midline; uvula is midline; shoulder elevation is normal.  Visual fields are intact.  MOTOR: Normal tone, bulk  and strength; no pronator drift.  COORDINATION: Left finger to nose is normal, right finger to nose is normal, No rest tremor; no intention tremor; no postural tremor; no bradykinesia.  REFLEXES: Deep tendon reflexes are symmetrical and normal. Babinski reflexes are flexor bilaterally.   SENSATION: Normal to light touch, temperature, and pinprick.   The patient does not extinguish to double simultaneous stimulation.    NIH stroke scale 1.     Blood pressure 114/77, pulse 68, temperature (!) 97.5 F (36.4 C), resp. rate 18, height  (1.778 m), weight 154 lb 14.4 oz (70.3 kg), SpO2 99 %.  Past Medical History:  Diagnosis Date  . Depression     Past Surgical History:  Procedure Laterality Date  . APPENDECTOMY    . BACK SURGERY      History reviewed. No pertinent family history.  Social History:  reports that he has quit smoking. He has never used smokeless tobacco. He reports that he does not drink alcohol or use drugs.  Allergies: No Known Allergies  Medications: Prior to Admission medications   Medication Sig Start Date End Date Taking? Authorizing Provider  escitalopram (LEXAPRO) 20 MG tablet Take 20 mg by mouth daily.   Yes [provider]  mirtazapine (REMERON) 15 MG tablet Take 45 mg by mouth at bedtime.    Yes [provider]    Scheduled Meds: . aspirin  300 mg Rectal Daily  Or  . aspirin  325 mg Oral Daily  . atorvastatin  40 mg Oral q1800  . butamben-tetracaine-benzocaine      . escitalopram  20 mg Oral Daily  . lidocaine      . meperidine      . midazolam      . mirtazapine  45 mg Oral QHS  . nicotine  21 mg Transdermal Daily  . pantoprazole  40 mg Oral Q0600  . senna-docusate  1 tablet Oral BID  . sodium chloride bacteriostatic       Continuous Infusions: PRN Meds:.acetaminophen **OR** acetaminophen (TYLENOL) oral liquid 160 mg/5 mL **OR** acetaminophen, ALPRAZolam     Results for orders placed or performed during the  hospital encounter of 08/07/17 (from the past 48 hour(s))  Urinalysis, Routine w reflex microscopic     Status: None   Collection Time: 08/10/17  6:20 PM  Result Value Ref Range   Color, Urine YELLOW YELLOW   APPearance CLEAR CLEAR   Specific Gravity, Urine 1.017 1.005 - 1.030   pH 5.0 5.0 - 8.0   Glucose, UA NEGATIVE NEGATIVE mg/dL   Hgb urine dipstick NEGATIVE NEGATIVE   Bilirubin Urine NEGATIVE NEGATIVE   Ketones, ur NEGATIVE NEGATIVE mg/dL   Protein, ur NEGATIVE NEGATIVE mg/dL   Nitrite NEGATIVE NEGATIVE   Leukocytes, UA NEGATIVE NEGATIVE  Vitamin B12     Status: None   Collection Time: 08/10/17  7:50 PM  Result Value Ref Range   Vitamin B-12 187 180 - 914 pg/mL    Comment: (NOTE) This assay is not validated for testing neonatal or myeloproliferative syndrome specimens for Vitamin B12 levels. Performed at [facility], [address] [place], [place] [address]   TSH     Status: None   Collection Time: 08/10/17  7:50 PM  Result Value Ref Range   TSH 1.786 0.350 - 4.500 uIU/mL    Comment: Performed by a 3rd Generation assay with a functional sensitivity of <=0.01 uIU/mL.  Sedimentation rate     Status: None   Collection Time: 08/10/17  7:50 PM  Result Value Ref Range   Sed Rate 16 0 - 16 mm/hr  C-reactive protein     Status: Abnormal   Collection Time: 08/10/17  7:50 PM  Result Value Ref Range   CRP 1.1 (H) <1.0 mg/dL    Comment: Performed at [facility], [address] [place], [place] [address]    Studies/Results:    BRAIN MRI IMPRESSION: MRI head:  1. Acute small RIGHT frontal lobe/MCA territory infarct. 2. Multifocal old bilateral frontoparietal/MCA territory infarcts. Multiple old cerebellar infarcts. 3. Mild chronic small vessel ischemic disease. 4. Mild parenchymal brain volume loss for age. MRA head:  1. No acute large vessel occlusion. 2. Flow-limiting stenosis LEFT vertebral artery. Moderate stenosis RIGHT A1 segment. 3. Given  numerous intracranial infarctions, recommend CT angiogram head and neck.      HEAD NECK CTA FINDINGS: CTA NECK FINDINGS  Aortic arch: Normal variant 4 vessel aortic arch with the left vertebral artery arising directly from the arch. Widely patent brachiocephalic and subclavian arteries. Mild non stenotic soft plaque in the proximal left subclavian artery.  Right carotid system: Patent without evidence of stenosis or dissection.  Left carotid system: Patent without evidence of stenosis or dissection.  Vertebral arteries: Patent without evidence of stenosis or dissection. Mildly dominant right vertebral artery.  Skeleton: No acute osseous abnormality or suspicious osseous lesion. Mild-to-moderate cervical disc degeneration.  Other neck:  No mass or lymph node enlargement.  Upper chest: Unremarkable.  Review of the MIP images confirms the above findings  CTA HEAD FINDINGS  Anterior circulation: The internal carotid arteries are widely patent from skullbase to carotid termini. The ACAs and MCAs are patent without evidence of proximal branch occlusion or significant stenosis. The left ACA is dominant, and there is an azygos A2 configuration. No aneurysm.  Posterior circulation: The intracranial vertebral arteries are patent to the basilar. There is a moderate left V4 stenosis just proximal to the PICA origin, and the more distal left vertebral artery is hypoplastic in appearance. Patent PICA and SCA origins are identified bilaterally. The basilar artery and is widely patent. There are large right and small left posterior communicating arteries with a hypoplastic right P1 segment noted. PCAs are patent without evidence of significant stenosis. No aneurysm.  Venous sinuses: Patent.  Anatomic variants: Fetal type origin of the right PCA. Dominant left ACA.  Delayed phase: No abnormal enhancement.  Review of the MIP images confirms the above  findings  IMPRESSION: 1. Widely patent cervical carotid and vertebral arteries. 2. Moderate left V4 stenosis. 3. No significant anterior circulation stenosis.      TTE - Left ventricle: The cavity size was normal. Wall thickness was   normal. Systolic function was normal. The estimated ejection   fraction was in the range of 60% to 65%. Wall motion was normal;   there were no regional wall motion abnormalities. Doppler   parameters are consistent with abnormal left ventricular   relaxation (grade 1 diastolic dysfunction). - Aortic valve: Valve area (VTI): 2.67 cm^2. Valve area (Vmax):   3.02 cm^2. Valve area (Vmean): 2.39 cm^2. - Technically adequate study.    CAROTID DOPPLERS normal    TEE Procedure:  The patient was moderately sedated with the above doses of versed and Demerol.  Using digital technique an omniplane probe was advanced into the distal esophagus without incident. Transgastric imaging revealed normal LV function with no RWMA;s and no mural apical thrombus. Estimated ejection fraction was 60%.  Right sided cardiac chambers were normal with no evidence of pulmonary hypertension.  The pulmonary and tricuspid valves were structurally normal.  There was no significant TR The mitral valve was structurally normal with trivial mitral regurgitation.   The aortic valve was trileaflet with no stenosis or regurgitation. The aortic root was normal.   The left atrial appendage was normal with no evidence of thrombus and normal velocities.  Imaging of the septum showed no ASD or VSD Bubble study was negative for shunt 2D and color flow confirmed no PFO  The LAE was well visualized in orthogonal views.  There was no spontaneous contrast and no thrombus.    The descending thoracic aorta had no mural aortic debris with no evidence of aneurysmal dilation or disection  Impression:  1) Normal LV systolic function and regional wall motion, LVEF 60%. 2) Trivial mitral  regurgitation. 3) The left atrial appendage was normal with no evidence of thrombus and normal velocities. 4) No intracardiac source of thrombus identified.         The brain MRI and MRA are reviewed in person. The MRI shows acute infarcts cortical base involving the right lateral temporal region. This is a moderate size infarct seen on about 5 cuts. There are a few other moderate to large size infarcts all cortical infarcts. They are seen involving the right cerebellum, left occipital parietal region approximating the posterior watershed area, right frontal region, into involving  the left occipital regions. These are associated with reduced signal consistent with encephalomalacia and increased signal on FLAIR imaging. No large vessel occlusion are seen on MRA.   Jaivyn Gulla A. Gerilyn Pilgrim, M.D.  Diplomate, Biomedical engineer of Psychiatry and Neurology ( Neurology). 08/11/2017, 7:39 PM

## 2017-08-11 NOTE — Procedures (Signed)
Transesophageal Echocardiogram: Indication: Acute CVA Sedation: Versed: 5 mg, Demerol: 50 mg    Procedure:  The patient was moderately sedated with the above doses of versed and Demerol.  Using digital technique an omniplane probe was advanced into the distal esophagus without incident. Transgastric imaging revealed normal LV function with no RWMA;s and no mural apical thrombus. Estimated ejection fraction was 60%.  Right sided cardiac chambers were normal with no evidence of pulmonary hypertension.  The pulmonary and tricuspid valves were structurally normal.  There was no significant TR The mitral valve was structurally normal with trivial mitral regurgitation.   The aortic valve was trileaflet with no stenosis or regurgitation. The aortic root was normal.   The left atrial appendage was normal with no evidence of thrombus and normal velocities.  Imaging of the septum showed no ASD or VSD Bubble study was negative for shunt 2D and color flow confirmed no PFO  The LAE was well visualized in orthogonal views.  There was no spontaneous contrast and no thrombus.    The descending thoracic aorta had no mural aortic debris with no evidence of aneurysmal dilation or disection  Impression:  1) Normal LV systolic function and regional wall motion, LVEF 60%. 2) Trivial mitral regurgitation. 3) The left atrial appendage was normal with no evidence of thrombus and normal velocities. 4) No intracardiac source of thrombus identified.  Prentice Docker 08/11/2017 10:39 AM

## 2017-08-11 NOTE — CV Procedure (Signed)
61 yr old male with h/o multiple bilateral CVA's. No known CAD. Neurology requests TEE to assess for cardioembolic source. Physical exam within normal limits. Vital signs stable.

## 2017-08-12 ENCOUNTER — Encounter (HOSPITAL_COMMUNITY): Payer: Self-pay | Admitting: Cardiovascular Disease

## 2017-08-12 ENCOUNTER — Inpatient Hospital Stay (HOSPITAL_COMMUNITY): Admit: 2017-08-12 | Payer: Self-pay

## 2017-08-12 DIAGNOSIS — I639 Cerebral infarction, unspecified: Principal | ICD-10-CM

## 2017-08-12 DIAGNOSIS — N289 Disorder of kidney and ureter, unspecified: Secondary | ICD-10-CM

## 2017-08-12 LAB — BASIC METABOLIC PANEL
Anion gap: 3 — ABNORMAL LOW (ref 5–15)
BUN: 19 mg/dL (ref 6–20)
CALCIUM: 8.5 mg/dL — AB (ref 8.9–10.3)
CO2: 31 mmol/L (ref 22–32)
CREATININE: 1.13 mg/dL (ref 0.61–1.24)
Chloride: 105 mmol/L (ref 101–111)
GFR calc Af Amer: >60 mL/min (ref >60)
Glucose, Bld: 92 mg/dL (ref 65–99)
Potassium: 4.1 mmol/L (ref 3.5–5.1)
SODIUM: 139 mmol/L (ref 135–145)

## 2017-08-12 LAB — CBC
HCT: 37.2 % — ABNORMAL LOW (ref 39.0–52.0)
Hemoglobin: 12.5 g/dL — ABNORMAL LOW (ref 13.0–17.0)
MCH: 31.3 pg (ref 26.0–34.0)
MCHC: 33.6 g/dL (ref 30.0–36.0)
MCV: 93.2 fL (ref 78.0–100.0)
PLATELETS: 201 10*3/uL (ref 150–400)
RBC: 3.99 MIL/uL — ABNORMAL LOW (ref 4.22–5.81)
RDW: 12.3 % (ref 11.5–15.5)
WBC: 4.5 10*3/uL (ref 4.0–10.5)

## 2017-08-12 LAB — HOMOCYSTEINE: Homocysteine: 17.7 umol/L — ABNORMAL HIGH (ref 0.0–15.0)

## 2017-08-12 LAB — RPR: RPR Ser Ql: NONREACTIVE

## 2017-08-12 LAB — HIV ANTIBODY (ROUTINE TESTING W REFLEX): HIV Screen 4th Generation wRfx: NONREACTIVE

## 2017-08-12 MED ORDER — ATORVASTATIN CALCIUM 40 MG PO TABS: 40.0000 mg | ORAL_TABLET | Freq: Every day | ORAL | 2 refills | Status: DC

## 2017-08-12 MED ORDER — ASPIRIN EC 81 MG PO TBEC: 81.0000 mg | DELAYED_RELEASE_TABLET | Freq: Every day | ORAL | Status: DC

## 2017-08-12 NOTE — Discharge Summary (Signed)
Physician Discharge Summary  ROSBEL BUCKNER ZOX:[id number] DOB: [birth year] DOA: 08/07/2017  PCP: Toma Deiters, MD  Admit date: 08/07/2017 Discharge date: 08/12/2017  Time spent: 45 minutes  Recommendations for Outpatient Follow-up:  -To be discharged home today. -30 day event monitor to be placed prior to discharge. -Patient will follow-up with Dr. Gerilyn Pilgrim in 2 weeks. His office will make this appointment.   Discharge Diagnoses:  Principal Problem:   Acute CVA (cerebrovascular accident) [facility] Active Problems:   Slurred speech   Renal insufficiency, mild   Abnormal CT of brain   Depression   Stroke [facility])   Discharge Condition: Stable and improved  Filed Weights   08/07/17 1749 08/07/17 2219  Weight: 68 kg (150 lb) 70.3 kg (154 lb 14.4 oz)    History of present illness:  As per Dr. Onalee Hua on 9/28: Anthony Sar is a 61 y.o. male with medical history significant of depression only comes in with slurred speech all day which started around 3am this am (18 hours ago).  He feels his mouth is weak and feels funny.no problem with is arms and legs.  No vision changes.  His dentures feel funny is his mouth and he feels like they are gagging him.  He does not take any aspirin products daily.  Denies any facial weakness or numbness except around his mouth.  Pt has abnormality of his ct head, no prev h/o stroke and is referred for admit for possible stroke,  Hospital Course:   Acute right frontal lobe/MCA territory infarct -Seen by neurology who recommended TEE which was negative for cardioembolic source of stroke. -30 day event monitor has been arranged, with plans to proceed with loop recorder if no abnormalities captured during the first 30 day period. -Aspirin for secondary stroke prevention. -2-D echo with ejection fraction of 60-65% and grade 1 diastolic dysfunction. -CTA head and neck which shows patent cervical carotid and vertebral arteries. -LDL is 137 and will be  started on Lipitor.  Acute renal failure -Resolved, likely due to prerenal azotemia  Hyperlipidemia -New diagnosis, started on Lipitor   Procedures:  As above   Consultations: Neurology   Discharge Instructions  Discharge Instructions    Diet - low sodium heart healthy    Complete by:  As directed    Increase activity slowly    Complete by:  As directed      Allergies as of 08/12/2017   No Known Allergies     Medication List    TAKE these medications   aspirin EC 81 MG tablet Take 1 tablet (81 mg total) by mouth daily.   atorvastatin 40 MG tablet Commonly known as:  LIPITOR Take 1 tablet (40 mg total) by mouth daily at 6 PM.   escitalopram 20 MG tablet Commonly known as:  LEXAPRO Take 20 mg by mouth daily.   mirtazapine 15 MG tablet Commonly known as:  REMERON Take 45 mg by mouth at bedtime.      No Known Allergies Follow-up Information    Toma Deiters, MD Follow up.   Specialty:  Internal Medicine Contact information: [address] [place] [address] [phone]            The results of significant diagnostics from this hospitalization (including imaging, microbiology, ancillary and laboratory) are listed below for reference.    Significant Diagnostic Studies: Ct Angio Head W Or Wo Contrast  Result Date: 08/08/2017 CLINICAL DATA:  Headache and slurred speech. Acute right  MCA infarct on MRI. EXAM: CT ANGIOGRAPHY HEAD AND NECK TECHNIQUE: Multidetector CT imaging of the head and neck was performed using the standard protocol during bolus administration of intravenous contrast. Multiplanar CT image reconstructions and MIPs were obtained to evaluate the vascular anatomy. Carotid stenosis measurements (when applicable) are obtained utilizing NASCET criteria, using the distal internal carotid diameter as the denominator. CONTRAST:  75 mL Isovue 370 COMPARISON:  Carotid Doppler ultrasound 08/08/2017. Head MRI/ MRA 08/07/2017. FINDINGS: CTA NECK  FINDINGS Aortic arch: Normal variant 4 vessel aortic arch with the left vertebral artery arising directly from the arch. Widely patent brachiocephalic and subclavian arteries. Mild non stenotic soft plaque in the proximal left subclavian artery. Right carotid system: Patent without evidence of stenosis or dissection. Left carotid system: Patent without evidence of stenosis or dissection. Vertebral arteries: Patent without evidence of stenosis or dissection. Mildly dominant right vertebral artery. Skeleton: No acute osseous abnormality or suspicious osseous lesion. Mild-to-moderate cervical disc degeneration. Other neck: No mass or lymph node enlargement. Upper chest: Unremarkable. Review of the MIP images confirms the above findings CTA HEAD FINDINGS Anterior circulation: The internal carotid arteries are widely patent from skullbase to carotid termini. The ACAs and MCAs are patent without evidence of proximal branch occlusion or significant stenosis. The left ACA is dominant, and there is an azygos A2 configuration. No aneurysm. Posterior circulation: The intracranial vertebral arteries are patent to the basilar. There is a moderate left V4 stenosis just proximal to the PICA origin, and the more distal left vertebral artery is hypoplastic in appearance. Patent PICA and SCA origins are identified bilaterally. The basilar artery and is widely patent. There are large right and small left posterior communicating arteries with a hypoplastic right P1 segment noted. PCAs are patent without evidence of significant stenosis. No aneurysm. Venous sinuses: Patent. Anatomic variants: Fetal type origin of the right PCA. Dominant left ACA. Delayed phase: No abnormal enhancement. Review of the MIP images confirms the above findings IMPRESSION: 1. Widely patent cervical carotid and vertebral arteries. 2. Moderate left V4 stenosis. 3. No significant anterior circulation stenosis. Electronically Signed   By: Sebastian Ache M.D.   On:  08/08/2017 11:06   Ct Head Wo Contrast  Result Date: 08/07/2017 CLINICAL DATA:  Headache, slurred speech. Last seen normal yesterday. EXAM: CT HEAD WITHOUT CONTRAST TECHNIQUE: Contiguous axial images were obtained from the base of the skull through the vertex without intravenous contrast. COMPARISON:  Sinus CT December 01, 2003 FINDINGS: BRAIN: No intraparenchymal hemorrhage, mass effect nor midline shift. Old cerebellar infarcts including RIGHT superior cerebellum. Old LEFT frontal and biparietal lobe infarcts. Hypodense RIGHT parietal lobe anterior to area of encephalomalacia. Mild ex vacuo dilatation LEFT occipital horn. No hydrocephalus. No abnormal extra-axial fluid collections. Basal cisterns are patent. VASCULAR: Mild dense intracranial vessels. SKULL: No skull fracture. No significant scalp soft tissue swelling. SINUSES/ORBITS: The mastoid air-cells and included paranasal sinuses are well-aerated.The included ocular globes and orbital contents are non-suspicious. OTHER: None. IMPRESSION: 1. Old bilateral MCA territory infarcts and multiple old cerebellar infarcts, potentially acute component RIGHT parietal lobe. MRI would be more sensitive. 2. Mildly dense intracranial vessels seen with hemoconcentration. Electronically Signed   By: Awilda Metro M.D.   On: 08/07/2017 19:13   Ct Angio Neck W Or Wo Contrast  Result Date: 08/08/2017 CLINICAL DATA:  Headache and slurred speech. Acute right MCA infarct on MRI. EXAM: CT ANGIOGRAPHY HEAD AND NECK TECHNIQUE: Multidetector CT imaging of the head and neck was performed using the standard  protocol during bolus administration of intravenous contrast. Multiplanar CT image reconstructions and MIPs were obtained to evaluate the vascular anatomy. Carotid stenosis measurements (when applicable) are obtained utilizing NASCET criteria, using the distal internal carotid diameter as the denominator. CONTRAST:  75 mL Isovue 370 COMPARISON:  Carotid Doppler ultrasound  08/08/2017. Head MRI/ MRA 08/07/2017. FINDINGS: CTA NECK FINDINGS Aortic arch: Normal variant 4 vessel aortic arch with the left vertebral artery arising directly from the arch. Widely patent brachiocephalic and subclavian arteries. Mild non stenotic soft plaque in the proximal left subclavian artery. Right carotid system: Patent without evidence of stenosis or dissection. Left carotid system: Patent without evidence of stenosis or dissection. Vertebral arteries: Patent without evidence of stenosis or dissection. Mildly dominant right vertebral artery. Skeleton: No acute osseous abnormality or suspicious osseous lesion. Mild-to-moderate cervical disc degeneration. Other neck: No mass or lymph node enlargement. Upper chest: Unremarkable. Review of the MIP images confirms the above findings CTA HEAD FINDINGS Anterior circulation: The internal carotid arteries are widely patent from skullbase to carotid termini. The ACAs and MCAs are patent without evidence of proximal branch occlusion or significant stenosis. The left ACA is dominant, and there is an azygos A2 configuration. No aneurysm. Posterior circulation: The intracranial vertebral arteries are patent to the basilar. There is a moderate left V4 stenosis just proximal to the PICA origin, and the more distal left vertebral artery is hypoplastic in appearance. Patent PICA and SCA origins are identified bilaterally. The basilar artery and is widely patent. There are large right and small left posterior communicating arteries with a hypoplastic right P1 segment noted. PCAs are patent without evidence of significant stenosis. No aneurysm. Venous sinuses: Patent. Anatomic variants: Fetal type origin of the right PCA. Dominant left ACA. Delayed phase: No abnormal enhancement. Review of the MIP images confirms the above findings IMPRESSION: 1. Widely patent cervical carotid and vertebral arteries. 2. Moderate left V4 stenosis. 3. No significant anterior circulation  stenosis. Electronically Signed   By: Sebastian Ache M.D.   On: 08/08/2017 11:06   Mr Maxine Glenn Head Wo Contrast  Result Date: 08/07/2017 CLINICAL DATA:  Headache and slurred speech. Follow-up abnormal head CT. EXAM: MRI HEAD WITHOUT CONTRAST MRA HEAD WITHOUT CONTRAST TECHNIQUE: Multiplanar, multiecho pulse sequences of the brain and surrounding structures were obtained without intravenous contrast. Angiographic images of the head were obtained using MRA technique without contrast. COMPARISON:  CT HEAD August 07, 2017 at 1858 hours FINDINGS: MRI HEAD FINDINGS- mildly motion degraded examination. BRAIN: 1.6 x 2.2 cm reduced diffusion RIGHT posterior frontal lobe with low ADC values. Punctate focus of reduced diffusion LEFT parietal lobe with low ADC value. Faint susceptibility artifact within biparietal encephalomalacia compatible with mineralization. Old bilateral cerebellar infarcts. Old small bifrontal infarcts. No midline shift, mass effect or masses. Mild prominence of ventricles sulci for patient's age. VASCULAR: Normal major intracranial vascular flow voids present at skull base. SKULL AND UPPER CERVICAL SPINE: No abnormal sellar expansion. No suspicious calvarial bone marrow signal. Craniocervical junction maintained. SINUSES/ORBITS: The mastoid air-cells and included paranasal sinuses are well-aerated. The included ocular globes and orbital contents are non-suspicious. OTHER: None. MRA HEAD FINDINGS ANTERIOR CIRCULATION: Normal flow related enhancement of the included cervical, petrous, cavernous and supraclinoid internal carotid arteries. Patent anterior communicating artery. Mild stenosis distal RIGHT A1 segment. Patent anterior and middle cerebral arteries, including distal segments. No large vessel occlusion, high-grade stenosis, aneurysm. POSTERIOR CIRCULATION: RIGHT vertebral artery is dominant. Flow limiting stenosis LEFT vertebral artery. Basilar artery is patent, with normal  flow related enhancement  of the main branch vessels. Patent posterior cerebral arteries. Robust RIGHT posterior communicating artery, small on the LEFT. No large vessel occlusion, aneurysm. ANATOMIC VARIANTS: Azygos ACA. Source images and MIP images were reviewed. IMPRESSION: MRI head: 1. Acute small RIGHT frontal lobe/MCA territory infarct. 2. Multifocal old bilateral frontoparietal/MCA territory infarcts. Multiple old cerebellar infarcts. 3. Mild chronic small vessel ischemic disease. 4. Mild parenchymal brain volume loss for age. MRA head: 1. No acute large vessel occlusion. 2. Flow-limiting stenosis LEFT vertebral artery. Moderate stenosis RIGHT A1 segment. 3. Given numerous intracranial infarctions, recommend CT angiogram head and neck. Electronically Signed   By: Awilda Metro M.D.   On: 08/07/2017 21:14   Mr Brain Wo Contrast  Result Date: 08/07/2017 CLINICAL DATA:  Headache and slurred speech. Follow-up abnormal head CT. EXAM: MRI HEAD WITHOUT CONTRAST MRA HEAD WITHOUT CONTRAST TECHNIQUE: Multiplanar, multiecho pulse sequences of the brain and surrounding structures were obtained without intravenous contrast. Angiographic images of the head were obtained using MRA technique without contrast. COMPARISON:  CT HEAD August 07, 2017 at 1858 hours FINDINGS: MRI HEAD FINDINGS- mildly motion degraded examination. BRAIN: 1.6 x 2.2 cm reduced diffusion RIGHT posterior frontal lobe with low ADC values. Punctate focus of reduced diffusion LEFT parietal lobe with low ADC value. Faint susceptibility artifact within biparietal encephalomalacia compatible with mineralization. Old bilateral cerebellar infarcts. Old small bifrontal infarcts. No midline shift, mass effect or masses. Mild prominence of ventricles sulci for patient's age. VASCULAR: Normal major intracranial vascular flow voids present at skull base. SKULL AND UPPER CERVICAL SPINE: No abnormal sellar expansion. No suspicious calvarial bone marrow signal. Craniocervical  junction maintained. SINUSES/ORBITS: The mastoid air-cells and included paranasal sinuses are well-aerated. The included ocular globes and orbital contents are non-suspicious. OTHER: None. MRA HEAD FINDINGS ANTERIOR CIRCULATION: Normal flow related enhancement of the included cervical, petrous, cavernous and supraclinoid internal carotid arteries. Patent anterior communicating artery. Mild stenosis distal RIGHT A1 segment. Patent anterior and middle cerebral arteries, including distal segments. No large vessel occlusion, high-grade stenosis, aneurysm. POSTERIOR CIRCULATION: RIGHT vertebral artery is dominant. Flow limiting stenosis LEFT vertebral artery. Basilar artery is patent, with normal flow related enhancement of the main branch vessels. Patent posterior cerebral arteries. Robust RIGHT posterior communicating artery, small on the LEFT. No large vessel occlusion, aneurysm. ANATOMIC VARIANTS: Azygos ACA. Source images and MIP images were reviewed. IMPRESSION: MRI head: 1. Acute small RIGHT frontal lobe/MCA territory infarct. 2. Multifocal old bilateral frontoparietal/MCA territory infarcts. Multiple old cerebellar infarcts. 3. Mild chronic small vessel ischemic disease. 4. Mild parenchymal brain volume loss for age. MRA head: 1. No acute large vessel occlusion. 2. Flow-limiting stenosis LEFT vertebral artery. Moderate stenosis RIGHT A1 segment. 3. Given numerous intracranial infarctions, recommend CT angiogram head and neck. Electronically Signed   By: Awilda Metro M.D.   On: 08/07/2017 21:14   US Carotid Bilateral (at [facility] Only)  Result Date: 08/08/2017 CLINICAL DATA:  Slurred speech for the past day. History of smoking. EXAM: BILATERAL CAROTID DUPLEX ULTRASOUND TECHNIQUE: Wallace Cullens scale imaging, color Doppler and duplex ultrasound were performed of bilateral carotid and vertebral arteries in the neck. COMPARISON:  None. FINDINGS: Criteria: Quantification of carotid stenosis is based on velocity  parameters that correlate the residual internal carotid diameter with NASCET-based stenosis levels, using the diameter of the distal internal carotid lumen as the denominator for stenosis measurement. The following velocity measurements were obtained: RIGHT ICA:  99/25 cm/sec CCA:  115/28 cm/sec SYSTOLIC ICA/CCA RATIO:  0.9  DIASTOLIC ICA/CCA RATIO:  0.9 ECA:  107 cm/sec LEFT ICA:  73/31 cm/sec CCA:  171/43 cm/sec SYSTOLIC ICA/CCA RATIO:  0.4 DIASTOLIC ICA/CCA RATIO:  0.7 ECA:  66 cm/sec RIGHT CAROTID ARTERY: There is no grayscale evidence of significant intimal thickening or atherosclerotic plaque affecting interrogated portions of the right carotid system. There are no elevated peak systolic velocities within the interrogated course of the right internal carotid artery to suggest a hemodynamically significant stenosis. RIGHT VERTEBRAL ARTERY:  Antegrade flow LEFT CAROTID ARTERY: There is no grayscale evidence significant intimal thickening or atherosclerotic plaque affecting interrogated portions of the left carotid system. There are no elevated peak systolic velocities within the interrogated course the left internal carotid artery to suggest a hemodynamically significant stenosis. LEFT VERTEBRAL ARTERY:  Antegrade flow IMPRESSION: Normal carotid Doppler ultrasound. Electronically Signed   By: Simonne Come M.D.   On: 08/08/2017 10:06    Microbiology: No results found for this or any previous visit (from the past 240 hour(s)).   Labs: Basic Metabolic Panel:  Recent Labs Lab 08/07/17 1827 08/08/17 0611 08/09/17 0453 08/12/17 0434  NA 141 138 141 139  K 3.8 3.8 4.3 4.1  CL 101 100* 107 105  CO2 GLUCOSE 104* 82 92 92  BUN CREATININE 1.28* 1.29* 1.25* 1.13  CALCIUM 9.4 8.8* 8.3* 8.5*   Liver Function Tests:  Recent Labs Lab 08/08/17 0611  AST 21  ALT 17  ALKPHOS 83  BILITOT 1.0  PROT 6.8  ALBUMIN 3.7   No results for input(s): LIPASE, AMYLASE in the last 168  hours. No results for input(s): AMMONIA in the last 168 hours. CBC:  Recent Labs Lab 08/07/17 1827 08/08/17 0611 08/12/17 0434  WBC 5.2 5.7 4.5  NEUTROABS 3.2  --   --   HGB 15.5 13.5 12.5*  HCT 45.7 40.3 37.2*  MCV 93.6 94.2 93.2  PLT 257 238 201   Cardiac Enzymes: No results for input(s): CKTOTAL, CKMB, CKMBINDEX, TROPONINI in the last 168 hours. BNP: BNP (last 3 results) No results for input(s): BNP in the last 8760 hours.  ProBNP (last 3 results) No results for input(s): PROBNP in the last 8760 hours.  CBG:  Recent Labs Lab 08/07/17 1754  GLUCAP 113*       Signed:  HERNANDEZ ACOSTA,ESTELA  [facility] Pager: [phone] 08/12/2017, 6:22 PM

## 2017-08-12 NOTE — Plan of Care (Signed)
Problem: Education: Goal: Knowledge of disease or condition will improve Outcome: Adequate for Discharge Pt educated on stroke medications he is taking as well as how to know if he is experiencing a stroke. Pt stated a couple times he had woken up and felt "weird" and wondered if one of those times he was experiencing a stroke. RN adv pt he needs to come to hospital if he starts feeling like that. RN also adv pt of holter monitor that was ordered for pt and reasoning for it. Pt verbalized understanding. Will continue to monitor pt

## 2017-08-12 NOTE — Progress Notes (Signed)
Patient being d/c home with sister in law. Patient being transported to [facility]hab for Holter monitor applied. IV cath removed and intact. No c/o pain at this time or at site. Patient ambulated in hallway this morning and tolerated well.

## 2017-08-13 ENCOUNTER — Telehealth: Payer: Self-pay

## 2017-08-13 DIAGNOSIS — I498 Other specified cardiac arrhythmias: Secondary | ICD-10-CM

## 2017-08-13 LAB — ANTINUCLEAR ANTIBODIES, IFA: ANTINUCLEAR ANTIBODIES, IFA: POSITIVE — AB

## 2017-08-13 LAB — FANA STAINING PATTERNS

## 2017-08-13 NOTE — Telephone Encounter (Signed)
-----   Message from Vallarie Mare sent at 08/12/2017  2:37 PM EDT ----- This pt is being d/c home today and is needing a 30 day event.   Thank you!

## 2017-08-13 NOTE — Telephone Encounter (Signed)
I spoke with Mr.Sedeno, he asks the [employer] calls his sister in law Nestor Lewandowsky be contacted for confirmation of his address for mailing of the Event monitor

## 2017-08-26 ENCOUNTER — Ambulatory Visit (INDEPENDENT_AMBULATORY_CARE_PROVIDER_SITE_OTHER): Payer: Self-pay

## 2017-08-26 DIAGNOSIS — I498 Other specified cardiac arrhythmias: Secondary | ICD-10-CM

## 2017-09-10 ENCOUNTER — Telehealth: Payer: Self-pay | Admitting: Cardiology

## 2017-09-10 ENCOUNTER — Encounter (HOSPITAL_COMMUNITY): Payer: Self-pay | Admitting: Emergency Medicine

## 2017-09-10 ENCOUNTER — Observation Stay (HOSPITAL_COMMUNITY)
Admission: EM | Admit: 2017-09-10 | Discharge: 2017-09-12 | Disposition: A | Discharge status: Home / Self Care | Payer: Self-pay | Attending: Family Medicine | Admitting: Family Medicine

## 2017-09-10 DIAGNOSIS — I4892 Unspecified atrial flutter: Principal | ICD-10-CM | POA: Insufficient documentation

## 2017-09-10 DIAGNOSIS — I10 Essential (primary) hypertension: Secondary | ICD-10-CM | POA: Insufficient documentation

## 2017-09-10 DIAGNOSIS — F32A Depression, unspecified: Secondary | ICD-10-CM | POA: Diagnosis present

## 2017-09-10 DIAGNOSIS — Z7901 Long term (current) use of anticoagulants: Secondary | ICD-10-CM | POA: Insufficient documentation

## 2017-09-10 DIAGNOSIS — I472 Ventricular tachycardia: Secondary | ICD-10-CM | POA: Insufficient documentation

## 2017-09-10 DIAGNOSIS — Z79899 Other long term (current) drug therapy: Secondary | ICD-10-CM | POA: Insufficient documentation

## 2017-09-10 DIAGNOSIS — N289 Disorder of kidney and ureter, unspecified: Secondary | ICD-10-CM | POA: Insufficient documentation

## 2017-09-10 DIAGNOSIS — Z7982 Long term (current) use of aspirin: Secondary | ICD-10-CM | POA: Insufficient documentation

## 2017-09-10 DIAGNOSIS — E785 Hyperlipidemia, unspecified: Secondary | ICD-10-CM | POA: Insufficient documentation

## 2017-09-10 DIAGNOSIS — F418 Other specified anxiety disorders: Secondary | ICD-10-CM | POA: Insufficient documentation

## 2017-09-10 DIAGNOSIS — Z8673 Personal history of transient ischemic attack (TIA), and cerebral infarction without residual deficits: Secondary | ICD-10-CM | POA: Insufficient documentation

## 2017-09-10 DIAGNOSIS — F329 Major depressive disorder, single episode, unspecified: Secondary | ICD-10-CM | POA: Diagnosis present

## 2017-09-10 DIAGNOSIS — I499 Cardiac arrhythmia, unspecified: Secondary | ICD-10-CM | POA: Diagnosis present

## 2017-09-10 DIAGNOSIS — R000 Tachycardia, unspecified: Secondary | ICD-10-CM

## 2017-09-10 DIAGNOSIS — Z87891 Personal history of nicotine dependence: Secondary | ICD-10-CM | POA: Insufficient documentation

## 2017-09-10 HISTORY — DX: Cerebral infarction, unspecified: I63.9

## 2017-09-10 HISTORY — DX: Essential (primary) hypertension: I10

## 2017-09-10 HISTORY — DX: Hyperlipidemia, unspecified: E78.5

## 2017-09-10 HISTORY — DX: Tobacco use: Z72.0

## 2017-09-10 HISTORY — DX: Disorder of kidney and ureter, unspecified: N28.9

## 2017-09-10 LAB — I-STAT TROPONIN, ED: TROPONIN I, POC: 0 ng/mL (ref 0.00–0.08)

## 2017-09-10 NOTE — ED Triage Notes (Signed)
Pt is wearing halter monitor and was called to come to ER to be evaluated. Pt denies any pain and states he was asleep when the facility called him. Family states the cardiologist at [facility] wanted him to be checked out.

## 2017-09-10 NOTE — Telephone Encounter (Addendum)
[employer] called to state that patient had an episode of wide complex tachycardia tonight but was asymptomatic but his POA is taking him to [facility].  Telemetry emailed to me and reviewed.  There is WCT which then narrows at a very similar HR and no discernable P waves ? Atrial flutter with intermittent abberation.  [facility] calling patient to got to [facility] for evaluation

## 2017-09-10 NOTE — ED Provider Notes (Signed)
[facility] Provider Note   CSN: [id number] Arrival date & time: 09/10/17  2231     History   Chief Complaint Chief Complaint  Patient presents with  . Abnormal ECG    HPI Anthony SarDavid A Guerrero is a 61 y.o. male.  Patient presents to the emergency department after being contacted by [employer] [facility] Monitoring service.  Patient was told to come to the emergency department because they saw something on the monitor, but he is uncertain what was seen.  He initially reported that he was in bed when this occurred and he did not have any problems, but in retrospect, now he does recall having some palpitations earlier tonight.  He has not had any chest pain.  There is no shortness of breath.  He is currently symptom-free.      Past Medical History:  Diagnosis Date  . Depression     Patient Active Problem List   Diagnosis Date Noted  . Acute CVA (cerebrovascular accident) (HCC) 08/08/2017  . Depression 08/08/2017  . Stroke (HCC) 08/08/2017  . Slurred speech 08/07/2017  . Renal insufficiency, mild 08/07/2017  . Abnormal CT of brain 08/07/2017    Past Surgical History:  Procedure Laterality Date  . APPENDECTOMY    . BACK SURGERY    . TEE WITHOUT CARDIOVERSION N/A 08/11/2017   Procedure: TRANSESOPHAGEAL ECHOCARDIOGRAM (TEE);  Surgeon: Laqueta LindenKoneswaran, Suresh A, MD;  Location: [facility];  Service: Cardiovascular;  Laterality: N/A;       Home Medications    Prior to Admission medications   Medication Sig Start Date End Date Taking? Authorizing Provider  aspirin EC 81 MG tablet Take 1 tablet (81 mg total) by mouth daily. 08/12/17   Philip AspenHernandez Acosta, Limmie PatriciaEstela Y, MD  atorvastatin (LIPITOR) 40 MG tablet Take 1 tablet (40 mg total) by mouth daily at 6 PM. 08/12/17   Philip AspenHernandez Acosta, Limmie PatriciaEstela Y, MD  escitalopram (LEXAPRO) 20 MG tablet Take 20 mg by mouth daily.    [provider]  mirtazapine (REMERON) 15 MG tablet Take 45 mg by mouth at bedtime.     [provider]    Family History No family history on file.  Social History Social History  Substance Use Topics  . Smoking status: Former Games developermoker  . Smokeless tobacco: Never Used  . Alcohol use No     Comment: former heavy drinker     Allergies   Patient has no known allergies.   Review of Systems Review of Systems  Respiratory: Negative for shortness of breath.   Cardiovascular: Positive for palpitations. Negative for chest pain.  All other systems reviewed and are negative.    Physical Exam Updated Vital Signs BP 124/82   Pulse 84   Temp 98.1 F (36.7 C) (Oral)   Resp 17   Ht 5\' 10"  (1.778 m)   Wt 75.8 kg (167 lb)   SpO2 95%   BMI 23.96 kg/m   Physical Exam  Constitutional: He is oriented to person, place, and time. He appears well-developed and well-nourished. No distress.  HENT:  Head: Normocephalic and atraumatic.  Right Ear: Hearing normal.  Left Ear: Hearing normal.  Nose: Nose normal.  Mouth/Throat: Oropharynx is clear and moist and mucous membranes are normal.  Eyes: Pupils are equal, round, and reactive to light. Conjunctivae and EOM are normal.  Neck: Normal range of motion. Neck supple.  Cardiovascular: Regular rhythm, S1 normal and S2 normal.  Exam reveals no gallop and no friction rub.  No murmur heard. Pulmonary/Chest: Effort normal and breath sounds normal. No respiratory distress. He exhibits no tenderness.  Abdominal: Soft. Normal appearance and bowel sounds are normal. There is no hepatosplenomegaly. There is no tenderness. There is no rebound, no guarding, no tenderness at McBurney's point and negative Murphy's sign. No hernia.  Musculoskeletal: Normal range of motion.  Neurological: He is alert and oriented to person, place, and time. He has normal strength. No cranial nerve deficit or sensory deficit. Coordination normal. GCS eye subscore is 4. GCS verbal subscore is 5. GCS motor subscore is 6.  Skin: Skin is warm, dry and intact. No  rash noted. No cyanosis.  Psychiatric: He has a normal mood and affect. His speech is normal and behavior is normal. Thought content normal.  Nursing note and vitals reviewed.    ED Treatments / Results  Labs (all labs ordered are listed, but only abnormal results are displayed) Labs Reviewed  COMPREHENSIVE METABOLIC PANEL - Abnormal; Notable for the following:       Result Value   BUN 21 (*)    Creatinine, Ser 1.26 (*)    GFR calc non Af Amer 60 (*)    All other components within normal limits  CBC WITH DIFFERENTIAL/PLATELET  MAGNESIUM  I-STAT TROPONIN, ED    EKG  EKG Interpretation  Date/Time:  Thursday September 10 2017 22:38:49 EDT Ventricular Rate:  79 PR Interval:  128 QRS Duration: 88 QT Interval:  394 QTC Calculation: 451 R Axis:   43 Text Interpretation:  Normal sinus rhythm Normal ECG Confirmed by Gilda Crease [phone]) on 09/10/2017 11:04:12 PM       Radiology No results found.  Procedures Procedures (including critical care time)  Medications Ordered in ED Medications  metoprolol tartrate (LOPRESSOR) 25 mg/10 mL oral suspension 25 mg (not administered)     Initial Impression / Assessment and Plan / ED Course  I have reviewed the triage vital signs and the nursing notes.  Pertinent labs & imaging results that were available during my care of the patient were reviewed by me and considered in my medical decision making (see chart for details).     Patient is currently wearing a 30-day event monitor because he has had multiple strokes.  The heart monitoring service contacted him at home today and he was instructed to come to the ER.  I did contact [employer] [phone], patient's device serial number [id number]) and was informed that the patient had 37 seconds of sustained ventricular tachycardia followed by 25 seconds of SVT tonight.  I did obtain the rhythm strips.  He did have a wide-complex tachycardia that was regular in nature followed by a  narrow complex tachycardia.  These tracings were discussed with Dr. Mayford Knife, on-call for cardiology.  She has reviewed the strips as well and feels that this is likely atrial flutter with aberrant conduction.  This would clinically fit much more than V. tach and SVT.  Dr. Mayford Knife recommends hospitalization here with cardiology consult in the morning.  She recommends Lopressor 25 mg twice daily and initiation of heparinization.  Final Clinical Impressions(s) / ED Diagnoses   Final diagnoses:  Wide-complex tachycardia [facility])    New Prescriptions New Prescriptions   No medications on file     Gilda Crease, MD 09/11/17 0150

## 2017-09-11 ENCOUNTER — Encounter (HOSPITAL_COMMUNITY): Payer: Self-pay | Admitting: Family Medicine

## 2017-09-11 DIAGNOSIS — R000 Tachycardia, unspecified: Secondary | ICD-10-CM | POA: Insufficient documentation

## 2017-09-11 DIAGNOSIS — I4892 Unspecified atrial flutter: Secondary | ICD-10-CM | POA: Diagnosis present

## 2017-09-11 DIAGNOSIS — I499 Cardiac arrhythmia, unspecified: Secondary | ICD-10-CM | POA: Diagnosis present

## 2017-09-11 DIAGNOSIS — I472 Ventricular tachycardia: Secondary | ICD-10-CM | POA: Insufficient documentation

## 2017-09-11 LAB — COMPREHENSIVE METABOLIC PANEL
ALK PHOS: 87 U/L (ref 38–126)
ALT: 17 U/L (ref 17–63)
ANION GAP: 9 (ref 5–15)
AST: 22 U/L (ref 15–41)
Albumin: 3.9 g/dL (ref 3.5–5.0)
BILIRUBIN TOTAL: 0.4 mg/dL (ref 0.3–1.2)
BUN: 21 mg/dL — ABNORMAL HIGH (ref 6–20)
CALCIUM: 9.1 mg/dL (ref 8.9–10.3)
CO2: 30 mmol/L (ref 22–32)
CREATININE: 1.26 mg/dL — AB (ref 0.61–1.24)
Chloride: 104 mmol/L (ref 101–111)
GFR calc Af Amer: >60 mL/min (ref >60)
GFR, EST NON AFRICAN AMERICAN: 60 mL/min — AB (ref >60)
Glucose, Bld: 78 mg/dL (ref 65–99)
Potassium: 4.1 mmol/L (ref 3.5–5.1)
Sodium: 143 mmol/L (ref 135–145)
TOTAL PROTEIN: 7 g/dL (ref 6.5–8.1)

## 2017-09-11 LAB — BASIC METABOLIC PANEL
Anion gap: 8 (ref 5–15)
BUN: 21 mg/dL — AB (ref 6–20)
CO2: 26 mmol/L (ref 22–32)
CREATININE: 1.12 mg/dL (ref 0.61–1.24)
Calcium: 8.7 mg/dL — ABNORMAL LOW (ref 8.9–10.3)
Chloride: 106 mmol/L (ref 101–111)
Glucose, Bld: 101 mg/dL — ABNORMAL HIGH (ref 65–99)
POTASSIUM: 3.9 mmol/L (ref 3.5–5.1)
SODIUM: 140 mmol/L (ref 135–145)

## 2017-09-11 LAB — CBC WITH DIFFERENTIAL/PLATELET
Basophils Absolute: 0 10*3/uL (ref 0.0–0.1)
Basophils Relative: 0 %
EOS ABS: 0.1 10*3/uL (ref 0.0–0.7)
Eosinophils Relative: 2 %
HEMATOCRIT: 42.2 % (ref 39.0–52.0)
HEMOGLOBIN: 13.9 g/dL (ref 13.0–17.0)
LYMPHS ABS: 2.6 10*3/uL (ref 0.7–4.0)
LYMPHS PCT: 45 %
MCH: 31.2 pg (ref 26.0–34.0)
MCHC: 32.9 g/dL (ref 30.0–36.0)
MCV: 94.8 fL (ref 78.0–100.0)
MONOS PCT: 11 %
Monocytes Absolute: 0.6 10*3/uL (ref 0.1–1.0)
NEUTROS ABS: 2.4 10*3/uL (ref 1.7–7.7)
NEUTROS PCT: 42 %
Platelets: 219 10*3/uL (ref 150–400)
RBC: 4.45 MIL/uL (ref 4.22–5.81)
RDW: 12.7 % (ref 11.5–15.5)
WBC: 5.8 10*3/uL (ref 4.0–10.5)

## 2017-09-11 LAB — TROPONIN I
Troponin I: <0.03 ng/mL (ref <0.03)
Troponin I: <0.03 ng/mL (ref <0.03)

## 2017-09-11 LAB — CBC
HCT: 40.5 % (ref 39.0–52.0)
Hemoglobin: 13.3 g/dL (ref 13.0–17.0)
MCH: 31.1 pg (ref 26.0–34.0)
MCHC: 32.8 g/dL (ref 30.0–36.0)
MCV: 94.8 fL (ref 78.0–100.0)
PLATELETS: 233 10*3/uL (ref 150–400)
RBC: 4.27 MIL/uL (ref 4.22–5.81)
RDW: 12.7 % (ref 11.5–15.5)
WBC: 6.1 10*3/uL (ref 4.0–10.5)

## 2017-09-11 LAB — PROTIME-INR
INR: 0.96
PROTHROMBIN TIME: 12.7 s (ref 11.4–15.2)

## 2017-09-11 LAB — HEPARIN LEVEL (UNFRACTIONATED): Heparin Unfractionated: 1.3 IU/mL — ABNORMAL HIGH (ref 0.30–0.70)

## 2017-09-11 LAB — APTT: APTT: 28 s (ref 24–36)

## 2017-09-11 LAB — MRSA PCR SCREENING: MRSA BY PCR: NEGATIVE

## 2017-09-11 LAB — MAGNESIUM: Magnesium: 2.2 mg/dL (ref 1.7–2.4)

## 2017-09-11 MED ORDER — ESCITALOPRAM OXALATE 10 MG PO TABS
20.0000 mg | ORAL_TABLET | Freq: Every day | ORAL | Status: DC
  Administered 2017-09-11 – 2017-09-12 (×2): 20 mg via ORAL
  Filled 2017-09-11: qty 1
  Filled 2017-09-11: qty 2
  Filled 2017-09-11 (×2): qty 1
  Filled 2017-09-11: qty 2
  Filled 2017-09-11: qty 1

## 2017-09-11 MED ORDER — FAMOTIDINE 20 MG PO TABS
40.0000 mg | ORAL_TABLET | Freq: Once | ORAL | Status: AC
  Administered 2017-09-11: 40 mg via ORAL
  Filled 2017-09-11: qty 2

## 2017-09-11 MED ORDER — ACETAMINOPHEN 325 MG PO TABS
650.0000 mg | ORAL_TABLET | ORAL | Status: DC | PRN
  Administered 2017-09-11 – 2017-09-12 (×3): 650 mg via ORAL
  Filled 2017-09-11 (×3): qty 2

## 2017-09-11 MED ORDER — ALPRAZOLAM 0.25 MG PO TABS
0.2500 mg | ORAL_TABLET | Freq: Two times a day (BID) | ORAL | Status: DC | PRN
  Administered 2017-09-11 – 2017-09-12 (×3): 0.25 mg via ORAL
  Filled 2017-09-11 (×3): qty 1

## 2017-09-11 MED ORDER — ONDANSETRON HCL 4 MG/2ML IJ SOLN: 4.0000 mg | Freq: Four times a day (QID) | INTRAMUSCULAR | Status: DC | PRN

## 2017-09-11 MED ORDER — NICOTINE 21 MG/24HR TD PT24
21.0000 mg | MEDICATED_PATCH | Freq: Once | TRANSDERMAL | Status: AC
  Administered 2017-09-11: 21 mg via TRANSDERMAL
  Filled 2017-09-11: qty 1

## 2017-09-11 MED ORDER — HEPARIN (PORCINE) IN NACL 100-0.45 UNIT/ML-% IJ SOLN
1100.0000 [IU]/h | INTRAMUSCULAR | Status: DC
  Administered 2017-09-11: 1100 [IU]/h via INTRAVENOUS
  Filled 2017-09-11: qty 250

## 2017-09-11 MED ORDER — COUMADIN BOOK
Freq: Once | Status: DC
  Filled 2017-09-11: qty 1

## 2017-09-11 MED ORDER — WARFARIN - PHARMACIST DOSING INPATIENT
Freq: Every day | Status: DC
  Administered 2017-09-11: 18:00:00

## 2017-09-11 MED ORDER — ACETAMINOPHEN 325 MG PO TABS
650.0000 mg | ORAL_TABLET | Freq: Once | ORAL | Status: AC
  Administered 2017-09-11: 650 mg via ORAL
  Filled 2017-09-11: qty 2

## 2017-09-11 MED ORDER — WARFARIN SODIUM 7.5 MG PO TABS
7.5000 mg | ORAL_TABLET | Freq: Once | ORAL | Status: AC
  Administered 2017-09-11: 7.5 mg via ORAL
  Filled 2017-09-11: qty 1

## 2017-09-11 MED ORDER — ENOXAPARIN SODIUM 80 MG/0.8ML ~~LOC~~ SOLN
75.0000 mg | Freq: Two times a day (BID) | SUBCUTANEOUS | Status: DC
  Administered 2017-09-11 – 2017-09-12 (×2): 75 mg via SUBCUTANEOUS
  Filled 2017-09-11 (×2): qty 0.8

## 2017-09-11 MED ORDER — ALPRAZOLAM 0.5 MG PO TABS
0.5000 mg | ORAL_TABLET | Freq: Once | ORAL | Status: AC
  Administered 2017-09-11: 0.5 mg via ORAL
  Filled 2017-09-11: qty 1

## 2017-09-11 MED ORDER — HEPARIN BOLUS VIA INFUSION
4000.0000 [IU] | Freq: Once | INTRAVENOUS | Status: AC
  Administered 2017-09-11: 4000 [IU] via INTRAVENOUS

## 2017-09-11 MED ORDER — MIRTAZAPINE 15 MG PO TABS
45.0000 mg | ORAL_TABLET | Freq: Every day | ORAL | Status: DC
  Administered 2017-09-11: 45 mg via ORAL
  Filled 2017-09-11 (×2): qty 1
  Filled 2017-09-11: qty 3
  Filled 2017-09-11: qty 1

## 2017-09-11 MED ORDER — HEPARIN (PORCINE) IN NACL 100-0.45 UNIT/ML-% IJ SOLN: 900.0000 [IU]/h | INTRAMUSCULAR | Status: DC

## 2017-09-11 MED ORDER — ATORVASTATIN CALCIUM 40 MG PO TABS
40.0000 mg | ORAL_TABLET | Freq: Every day | ORAL | Status: DC
  Administered 2017-09-11: 40 mg via ORAL
  Filled 2017-09-11: qty 1

## 2017-09-11 MED ORDER — SODIUM CHLORIDE 0.9 % IV SOLN
INTRAVENOUS | Status: AC
  Administered 2017-09-11: 02:00:00 via INTRAVENOUS

## 2017-09-11 MED ORDER — ALUM & MAG HYDROXIDE-SIMETH 200-200-20 MG/5ML PO SUSP: 15.0000 mL | ORAL | Status: DC | PRN

## 2017-09-11 MED ORDER — ASPIRIN EC 81 MG PO TBEC
81.0000 mg | DELAYED_RELEASE_TABLET | Freq: Every day | ORAL | Status: DC
  Administered 2017-09-11 – 2017-09-12 (×2): 81 mg via ORAL
  Filled 2017-09-11 (×2): qty 1

## 2017-09-11 MED ORDER — APIXABAN 5 MG PO TABS
5.0000 mg | ORAL_TABLET | Freq: Two times a day (BID) | ORAL | Status: DC
  Administered 2017-09-11: 5 mg via ORAL
  Filled 2017-09-11: qty 1

## 2017-09-11 MED ORDER — METOPROLOL TARTRATE 25 MG PO TABS
25.0000 mg | ORAL_TABLET | Freq: Two times a day (BID) | ORAL | Status: DC
  Administered 2017-09-11: 25 mg via ORAL
  Filled 2017-09-11 (×2): qty 1

## 2017-09-11 MED ORDER — WARFARIN VIDEO
Freq: Once | Status: AC
  Administered 2017-09-11: 18:00:00

## 2017-09-11 MED ORDER — METOPROLOL TARTRATE 25 MG/10 ML ORAL SUSPENSION
25.0000 mg | Freq: Two times a day (BID) | ORAL | Status: DC
  Administered 2017-09-11: 25 mg via ORAL
  Filled 2017-09-11 (×2): qty 10

## 2017-09-11 NOTE — Progress Notes (Signed)
Attempted to play coumadin video on the patient education channel.  The phone number listed for educational videos rings constantly and the channel is blurred and static. Waiting for pharmacy to send coumadin booklet to review with patient.

## 2017-09-11 NOTE — Progress Notes (Signed)
Patient admitted overnight with new onset atrial flutter.  Agree with Dr. Antionette Charpyd H&P.  2/2 lack of insurance patient will likely need to start lovenox and coumadin.  Family possibly may be able to pay out of pocket for eliquis if they know how many months they will need to pay for prior to patient getting obamacare.  Will f/u tomorrow.

## 2017-09-11 NOTE — Progress Notes (Signed)
ANTICOAGULATION CONSULT NOTE   Pharmacy Consult for coumadin/lovenox Indication: Atrial fibrillation  No Known Allergies  Patient Measurements: Height: 5\' 11"  (180.3 cm) Weight: 160 lb 15 oz (73 kg) IBW/kg (Calculated) : 75.3 HEPARIN DW (KG): 73   Vital Signs: Temp: 97.9 F (36.6 C) (11/02 1700) Temp Source: Oral (11/02 1700) BP: 110/80 (11/02 1600) Pulse Rate: 62 (11/02 1600)  Labs:  Recent Labs  09/10/17 2326 09/11/17 0415 09/11/17 0838 09/11/17 1324  HGB 13.9 13.3  --   --   HCT 42.2 40.5  --   --   PLT 219 233  --   --   APTT 28  --   --   --   LABPROT 12.7  --   --   --   INR 0.96  --   --   --   HEPARINUNFRC  --   --  1.30*  --   CREATININE 1.26* 1.12  --   --   TROPONINI  --  <0.03 <0.03 <0.03   Estimated Creatinine Clearance: 71.5 mL/min (by C-G formula based on SCr of 1.12 mg/dL).  Medical History: Past Medical History:  Diagnosis Date  . CVA (cerebral vascular accident) (HCC)   . Depression   . Hyperlipidemia   . Hypertension   . Renal insufficiency   . Tobacco abuse     Medications:    Assessment: 61 yo male on home cardiac monitoring seen in the ED for evaluation due to reported abnormalities seen by his heart monitoring service. Cardiology consulted and recommended heparin infusion.  He received one dose of eliquis earlier today.  He is not able to afford eliquis so he will be changed to coumadin with lovenox bridge  Goal of Therapy:  INR 2-3 Monitor platelets by anticoagulation protocol: Yes   Plan:  Coumadin 7.5 mg po today.  Start lovenox later tonight, 1mg /kg sq q12 hours Daily PT/INR Monitor for bleeding complications F/u coumadin education  [name], Jhania Etherington Poteet, RPH 09/11/2017,5:16 PM

## 2017-09-11 NOTE — Progress Notes (Addendum)
ANTICOAGULATION CONSULT NOTE   Pharmacy Consult for Eliquis Indication: Atrial fibrillation  No Known Allergies  Patient Measurements: Height: 5\' 11"  (180.3 cm) Weight: 160 lb 15 oz (73 kg) IBW/kg (Calculated) : 75.3 HEPARIN DW (KG): 73   Vital Signs: Temp: 97.8 F (36.6 C) (11/02 0803) Temp Source: Oral (11/02 0803) BP: 104/77 (11/02 0600) Pulse Rate: 70 (11/02 0600)  Labs:  Recent Labs  09/10/17 2326 09/11/17 0415 09/11/17 0838  HGB 13.9 13.3  --   HCT 42.2 40.5  --   PLT 219 233  --   APTT 28  --   --   LABPROT 12.7  --   --   INR 0.96  --   --   HEPARINUNFRC  --   --  1.30*  CREATININE 1.26* 1.12  --   TROPONINI  --  <0.03 <0.03   Estimated Creatinine Clearance: 71.5 mL/min (by C-G formula based on SCr of 1.12 mg/dL).  Medical History: Past Medical History:  Diagnosis Date  . CVA (cerebral vascular accident) (HCC)   . Depression   . Hyperlipidemia   . Hypertension   . Renal insufficiency   . Tobacco abuse     Medications:    Assessment: 61 yo male on home cardiac monitoring seen in the ED for evaluation due to reported abnormalities seen by his heart monitoring service. Cardiology consulted and recommended heparin infusion.  Now to change to eliquis  Goal of Therapy:  Therapeutic anticoagulation Monitor platelets by anticoagulation protocol: Yes   Plan:  Eliquis 5 mg po bid Monitor for bleeding complications F/u eliquis education  [name], Charles Crawford, RPH 09/11/2017,10:29 AM

## 2017-09-11 NOTE — Plan of Care (Signed)
Problem: Pain Managment: Goal: General experience of comfort will improve Outcome: Progressing Pain will be assessed every shift and as needed.  Pain will be controlled with PRN pain medications and MD will be notified if pain medications are ineffective for the patient.

## 2017-09-11 NOTE — Plan of Care (Signed)
Problem: Education: Goal: Knowledge of [facility] Education information/materials will improve Outcome: Progressing  Reviewed pt information guide, oriented to room and unit routines.  Instructed on use of call bell and need for side rails,  bed alarm.

## 2017-09-11 NOTE — Progress Notes (Signed)
ANTICOAGULATION CONSULT NOTE - Preliminary  Pharmacy Consult for Heparin Indication: Atrial fibrillation  No Known Allergies  Patient Measurements: Height: 5\' 10"  (177.8 cm) Weight: 167 lb (75.8 kg) IBW/kg (Calculated) : 73 HEPARIN DW (KG): 75.8   Vital Signs: Temp: 98.1 F (36.7 C) (11/01 2237) Temp Source: Oral (11/01 2237) BP: 124/82 (11/02 0130) Pulse Rate: 84 (11/02 0130)  Labs:  Recent Labs  09/10/17 2326  HGB 13.9  HCT 42.2  PLT 219  CREATININE 1.26*   Estimated Creatinine Clearance: 63.6 mL/min (A) (by C-G formula based on SCr of 1.26 mg/dL (H)).  Medical History: Past Medical History:  Diagnosis Date  . Depression     Medications:    Assessment: 61 yo male on home cardiac monitoring seen in the ED for evaluation due to reported abnormalities seen by his heart monitoring service. Cardiology consulted and recommended heparin infusion. Pharmacy has been asked to dose IV heparin.    Goal of Therapy:  Heparin level goal: 0.3-0.7 units/ml Monitor platelets by anticoagulation protocol: Yes   Plan:  Heparin 4000 unit IV loading dose Heparin infusion at 1100 units/hr Heparin level in 6-8 hours  Preliminary review of pertinent patient information completed.  Jeani HawkingAnnie Penn clinical pharmacist will complete review during morning rounds to assess the patient and finalize treatment regimen.  Arelia SneddonMason, Grayling Schranz Anne, [facility] HospitalRPH 09/11/2017,2:05 AM

## 2017-09-11 NOTE — Consult Note (Addendum)
Cardiology Consultation:   Patient ID: Charles Crawford; [id number]; 1956-01-22   Admit date: 09/10/2017 Date of Consult: 09/11/2017  Primary Care Provider: Wilson Singer, MD Primary Cardiologist: Prentice Docker MD  Patient Profile:   Charles Crawford is a 61 y.o. male with a hx of acute CVA with recent admission on 08/07/2017, status post TEE by Dr. Purvis Sheffield which did not reveal thrombus,, history of renal insufficiency, depression, hyperlipidemia who is being seen today for the evaluation of new onset atrial flutter at the request of Dr. Rinaldo Ratel,. Hospitalist Service.  On discharge on 08/12/2017 the patient had a 30-day cardiac monitor placed to evaluate for arrhythmia.  History of Present Illness:   Charles Crawford presented to the emergency room after being advised to do so in the setting of abnormal cardiac monitor, which revealed wide-complex tachycardia.  This was read by Dr. Carolanne Grumbling on the evening of 09/10/2017, with her report documenting   "There is WCT which then narrows at a very similar heart rate and no discernible P waves.  Question atrial flutter with intermittent aberration"   The patient states he had felt some fluttering in his chest around that same time.  He was resting in bed eating cookies and watching television when he received a phone call.  He denied any chest pain or shortness of breath associated. He also has been complaining of severe heart burn and burping last night.   On arrival to the emergency room the patient is without complaint, EKG revealed normal sinus rhythm.  Blood pressure 122/91, O2 sat 98% he was afebrile.  Pertinent labs did not reveal anemia or thrombocytopenia.  Chemistries were within normal limits with exception of BUN elevation of 21 and creatinine 1.26.  Magnesium 2.2.  PT 12.7 INR 0.96.  He was treated with 25 mg of metoprolol p.o. Tylenol, Pepcid 40 mg p.o. Xanax 0.5 mg p.o, NicoDerm patch was placed he was started on heparin infusion  after bolus. CHADS VASC Score of 4.    Past Medical History:  Diagnosis Date  . CVA (cerebral vascular accident) (HCC)   . Depression   . Hyperlipidemia   . Hypertension   . Renal insufficiency   . Tobacco abuse     Past Surgical History:  Procedure Laterality Date  . APPENDECTOMY    . BACK SURGERY    . TEE WITHOUT CARDIOVERSION N/A 08/11/2017   Procedure: TRANSESOPHAGEAL ECHOCARDIOGRAM (TEE);  Surgeon: Laqueta Linden, MD;  Location: [facility];  Service: Cardiovascular;  Laterality: N/A;     Home Medications:  Prior to Admission medications   Medication Sig Start Date End Date Taking? Authorizing Provider  aspirin EC 81 MG tablet Take 1 tablet (81 mg total) by mouth daily. 08/12/17   Philip Aspen, Limmie Patricia, MD  atorvastatin (LIPITOR) 40 MG tablet Take 1 tablet (40 mg total) by mouth daily at 6 PM. 08/12/17   Philip Aspen, Limmie Patricia, MD  escitalopram (LEXAPRO) 20 MG tablet Take 20 mg by mouth daily.    [provider]  mirtazapine (REMERON) 15 MG tablet Take 45 mg by mouth at bedtime.     [provider]    Inpatient Medications: Scheduled Meds: . aspirin EC  81 mg Oral Daily  . atorvastatin  40 mg Oral q1800  . escitalopram  20 mg Oral Daily  . metoprolol tartrate  25 mg Oral BID  . mirtazapine  45 mg Oral QHS  . nicotine  21 mg Transdermal Once  Continuous Infusions: . sodium chloride 75 mL/hr at 09/11/17 0224  . heparin 1,100 Units/hr (09/11/17 0227)   PRN Meds: acetaminophen, ALPRAZolam, alum & mag hydroxide-simeth, ondansetron (ZOFRAN) IV  Allergies:   No Known Allergies  Social History:   Social History   Social History  . Marital status: Widowed    Spouse name: N/A  . Number of children: N/A  . Years of education: N/A   Occupational History  . Not on file.   Social History Main Topics  . Smoking status: Former Games developer  . Smokeless tobacco: Never Used  . Alcohol use No     Comment: former heavy drinker  . Drug use:  No  . Sexual activity: Not on file   Other Topics Concern  . Not on file   Social History Narrative  . No narrative on file    Family History:   Family History  Problem Relation Age of Onset  . CVA Mother   . CAD Father   . Heart attack Father   . Hypertension Sister   . CVA Maternal Grandmother   . CVA Maternal Grandfather   . Heart attack Paternal Grandmother      ROS:  Please see the history of present illness.  ROS  All other ROS reviewed and negative.     Physical Exam/Data:   Vitals:   09/11/17 0400 09/11/17 0500 09/11/17 0600 09/11/17 0803  BP: 112/81 92/67 104/77   Pulse: 66 67 70   Resp: 15 13 12    Temp:    97.8 F (36.6 C)  TempSrc:    Oral  SpO2: 98% 96% 97%   Weight:      Height:        Intake/Output Summary (Last 24 hours) at 09/11/17 0909 Last data filed at 09/11/17 1610  Gross per 24 hour  Intake           377.05 ml  Output              250 ml  Net           127.05 ml   Filed Weights   09/10/17 2235 09/11/17 0318  Weight: 167 lb (75.8 kg) 160 lb 15 oz (73 kg)   Body mass index is 22.45 kg/m.  General:  Well nourished, well developed, in no acute distress HEENT: normal Lymph: no adenopathy Neck: no JVD Endocrine:  No thryomegaly Vascular: No carotid bruits; FA pulses 2+ bilaterally without bruits  Cardiac:  normal S1, S2; RRR; no murmur 1/6 systolic  Lungs:  clear to auscultation bilaterally, no wheezing, rhonchi or rales  Abd: soft, nontender, no hepatomegaly  Ext: no edema Musculoskeletal:  No deformities, BUE and BLE strength normal and equal Skin: warm and dry  Neuro:  CNs 2-12 intact, no focal abnormalities noted Psych:  Normal affect   EKG:  The EKG was personally reviewed and demonstrates: NSR 79 bpm.   Telemetry:  Telemetry was personally reviewed and demonstrates: Normal sinus rhythm heart rate 72 bpm.  Relevant CV Studies: Echocardiogram 08/08/2017 Left ventricle: The cavity size was normal. Wall thickness was    normal. Systolic function was normal. The estimated ejection   fraction was in the range of 60% to 65%. Wall motion was normal;   there were no regional wall motion abnormalities. Doppler   parameters are consistent with abnormal left ventricular   relaxation (grade 1 diastolic dysfunction). - Aortic valve: Valve area (VTI): 2.67 cm^2. Valve area (Vmax):   3.02 cm^2. Valve area (  Vmean): 2.39 cm^2. - Technically adequate study.  TEE 08/11/2017 Impression:  1) Normal LV systolic function and regional wall motion, LVEF 60%. 2) Trivial mitral regurgitation. 3) The left atrial appendage was normal with no evidence of thrombus and normal velocities. 4) No intracardiac source of thrombus identified.  CT Scan of Head and Neck 07/12/2017 IMPRESSION: 1. Widely patent cervical carotid and vertebral arteries. 2. Moderate left V4 stenosis. 3. No significant anterior circulation stenosis.  CT Scan with Contrast 08/07/2017 IMPRESSION: 1. Old bilateral MCA territory infarcts and multiple old cerebellar infarcts, potentially acute component RIGHT parietal lobe. MRI would be more sensitive. 2. Mildly dense intracranial vessels seen with hemoconcentration.  MRI Brain 07/12/2017 IMPRESSION: MRI head:  1. Acute small RIGHT frontal lobe/MCA territory infarct. 2. Multifocal old bilateral frontoparietal/MCA territory infarcts. Multiple old cerebellar infarcts. 3. Mild chronic small vessel ischemic disease. 4. Mild parenchymal brain volume loss for age. MRA head:  1. No acute large vessel occlusion. 2. Flow-limiting stenosis LEFT vertebral artery. Moderate stenosis RIGHT A1 segment.   Laboratory Data:  Chemistry  Recent Labs Lab 09/10/17 2326 09/11/17 0415  NA 143 140  K 4.1 3.9  CL 104 106  CO2 30 26  GLUCOSE 78 101*  BUN 21* 21*  CREATININE 1.26* 1.12  CALCIUM 9.1 8.7*  GFRNONAA 60* >60  GFRAA >60 >60  ANIONGAP 9 8     Recent Labs Lab 09/10/17 2326  PROT 7.0  ALBUMIN 3.9   AST 22  ALT 17  ALKPHOS 87  BILITOT 0.4   Hematology  Recent Labs Lab 09/10/17 2326 09/11/17 0415  WBC 5.8 6.1  RBC 4.45 4.27  HGB 13.9 13.3  HCT 42.2 40.5  MCV 94.8 94.8  MCH 31.2 31.1  MCHC 32.9 32.8  RDW 12.7 12.7  PLT 219 233   Cardiac Enzymes  Recent Labs Lab 09/11/17 0415  TROPONINI <0.03     Recent Labs Lab 09/10/17 2349  TROPIPOC 0.00    BNPNo results for input(s): BNP, PROBNP in the last 168 hours.  DDimer No results for input(s): DDIMER in the last 168 hours.  Radiology/Studies:  No results found.  Assessment and Plan:   1.  New onset wide-complex tachycardia with atrial flutter: Patient now on heparin drip.  Has had a recent TEE on last admission less than one month ago without iliac emboli noted.  He is currently in normal sinus rhythm, likely paroxysmal in etiology. CHADS VASC Score of 3 with microvascular disease, CVA,.  Consider transition to p.o. anticoagulation if no ischemic testing is planned.  Most recent echocardiogram revealed normal LV systolic function without wall motion abnormalities.  He has a family history of heart disease with multiple MIs and paternal family history along with multiple CVAs in maternal family history.    Continues on metoprolol 25 mg twice daily with good heart rate control.  No arrhythmias seen on telemetry since admission.  2.  History of multiple CVA: MRI on hospitalization less than 1 month ago revealed multiple MCA territory strokes.  Evidence of microvascular disease.  Is currently on statin therapy and aspirin.  If DOAC would need to discontinue aspirin.  3.  Hypercholesterolemia: Currently on atorvastatin 40 mg daily.  4.  Anxiety and depression: Remains on Xanax, Remeron, Lexapro.  5. Severe Burping and heartburn: Apparently has become more severe since hospitalization.  Currently on H2 blocker.  Pepcid 40 mg daily.    For questions or updates, please contact [facility] Please consult  [email or web address] for contact info  under Cardiology/STEMI.   Signed, Joni Reining, NP  09/11/2017 9:09 AM   Attending note Patient seen and discussed with DNP Lyman Bishop, I agree with her documentation above. History of CVA wearing a home monitor as part of workup admitted with episode of wide complex tachycardia. Strips do show a wide complex tachcyardia, at roughly 160, wide complex resolves and heart rate is shown to be in aflutter with RVR at a similar rate consistent with aflutter with aberrancy.   Hgb 13.9 Plt 219 K 4.1 Cr 1.26 Mg 2.2  Trop neg x 2  EKG NSR 07/2017 echo: LVEF 60-65%, no WMAs, grade I diastolicdysfunction  Patient admitted with aflutter with aberrancy, new diagnosis of aflutter. Likely played a role in his recent CVA. Would recommend oral anticoagulation if ok with neuro,currently on heparin gtt. If ok with it would start eliquis 5mg  bid.  Continue lopressor for rate control. Patient already with recent echo that was benign. If rates remain stable this morning and afternoon could consider discharge later today.   We will reach out to Dr Janann Colonel to clear use of eliquis given recent CVA. Continue lopressor. We will arrange outpatient f/u in 3 weeks. Would continue to wear heart monitor at discharge so we can evaluate effectiveness of therapy. Likely ok for discharge later today if rates remain controlled and anticoag is in place.   Dina Rich MD  [phone] addendum DOAC cleared by Dr Janann Colonel, we will d/c heparin and start eliquis per pharmacy protocole. No additional cardiology testing or intervention planned at this time. We will arragnge outpatient f/u in 3 weeks.   Dina Rich MD

## 2017-09-11 NOTE — H&P (Signed)
History and Physical    Charles Crawford:[id number] DOB: 11-Aug-1956 DOA: 09/10/2017  PCP: Charles Singer, MD   Patient coming from: Home  Chief Complaint: Cardiac arrhythmia on ambulatory monitor   HPI: Charles Crawford is a 61 y.o. male with medical history significant for depression with anxiety and ischemic strokes, now presenting to the emergency department after being notified that there was an arrhythmia his ambulatory heart monitor.  Patient had been admitted to the hospital about a month ago with slurred speech, was found to have an acute ischemic stroke, and was also noted on imaging suffered prior strokes.  Patient was in a sinus rhythm at that time and TEE normal EF and no thrombus.  He was discharged home with a 30-day ambulatory cardiac monitor.  He had been in his usual state of health and was asleep when he was called by the monitoring device company, notifying him that an arrhythmia had been detected (interpreted by the device as 37 seconds of ventricular tachycardia), and directed him to the emergency department.  Patient believes that he may have been feeling some palpitations around that time, but has otherwise been well.  ED Course: Upon arrival to the ED, patient is found to be afebrile, saturating well on room air, and with vitals otherwise stable.  EKG features normal sinus rhythm, chemistry panel reveals a chronic mild renal insufficiency with creatinine 1.26, and CBC is patient's cardiologist had also been alerted to the arrhythmia and reviewed the rhythm strips, suspecting that this was an episode of atrial flutter with aberrant conduction.  She discussed the case with the ED physician and recommended medical admission with initiation of Lopressor and heparin infusion.  Patient was given a dose of oral Lopressor, started on heparin infusion, remained hemodynamically stable and in a normal sinus rhythm, stepdown unit for ongoing evaluation and management.  Review of Systems:   All other systems reviewed and apart from HPI, are negative.  Past Medical History:  Diagnosis Date  . Depression     Past Surgical History:  Procedure Laterality Date  . APPENDECTOMY    . BACK SURGERY    . TEE WITHOUT CARDIOVERSION N/A 08/11/2017   Procedure: TRANSESOPHAGEAL ECHOCARDIOGRAM (TEE);  Surgeon: Laqueta Linden, MD;  Location: [facility];  Service: Cardiovascular;  Laterality: N/A;     reports that he has quit smoking. He has never used smokeless tobacco. He reports that he does not drink alcohol or use drugs.  No Known Allergies  History reviewed. No pertinent family history.   Prior to Admission medications   Medication Sig Start Date End Date Taking? Authorizing Provider  aspirin EC 81 MG tablet Take 1 tablet (81 mg total) by mouth daily. 08/12/17   Philip Aspen, Limmie Patricia, MD  atorvastatin (LIPITOR) 40 MG tablet Take 1 tablet (40 mg total) by mouth daily at 6 PM. 08/12/17   Philip Aspen, Limmie Patricia, MD  escitalopram (LEXAPRO) 20 MG tablet Take 20 mg by mouth daily.    [provider]  mirtazapine (REMERON) 15 MG tablet Take 45 mg by mouth at bedtime.     [provider]    Physical Exam: Vitals:   09/10/17 2237 09/11/17 0106 09/11/17 0117 09/11/17 0130  BP: (!) 122/91 (!) 125/91  124/82  Pulse: 78  63 84  Resp: 18  17   Temp: 98.1 F (36.7 C)     TempSrc: Oral     SpO2: 98%  95% 95%  Weight:  Height:          Constitutional: NAD, calm, comfortable Eyes: PERTLA, lids and conjunctivae normal ENMT: Mucous membranes are moist. Posterior pharynx clear of any exudate or lesions.   Neck: normal, supple, no masses, no thyromegaly Respiratory: clear to auscultation bilaterally, no wheezing, no crackles. Normal respiratory effort.   Cardiovascular: S1 & S2 heard, regular rate and rhythm. No extremity edema. No significant JVD. Abdomen: No distension, no tenderness, no masses palpated. Bowel sounds normal.  Musculoskeletal:  no clubbing / cyanosis. No joint deformity upper and lower extremities.  Skin: no significant rashes, lesions, ulcers. Warm, dry, well-perfused. Neurologic: Mild dysarthria. Sensation intact. Strength 5/5 in all 4 limbs.  Psychiatric: Alert and oriented x 3. Calm, cooperative.     Labs on Admission: I have personally reviewed following labs and imaging studies  CBC:  Recent Labs Lab 09/10/17 2326  WBC 5.8  NEUTROABS 2.4  HGB 13.9  HCT 42.2  MCV 94.8  PLT 219   Basic Metabolic Panel:  Recent Labs Lab 09/10/17 2326  NA 143  K 4.1  CL 104  CO2 30  GLUCOSE 78  BUN 21*  CREATININE 1.26*  CALCIUM 9.1  MG 2.2   GFR: Estimated Creatinine Clearance: 63.6 mL/min (A) (by C-G formula based on SCr of 1.26 mg/dL (H)). Liver Function Tests:  Recent Labs Lab 09/10/17 2326  AST 22  ALT 17  ALKPHOS 87  BILITOT 0.4  PROT 7.0  ALBUMIN 3.9   No results for input(s): LIPASE, AMYLASE in the last 168 hours. No results for input(s): AMMONIA in the last 168 hours. Coagulation Profile: No results for input(s): INR, PROTIME in the last 168 hours. Cardiac Enzymes: No results for input(s): CKTOTAL, CKMB, CKMBINDEX, TROPONINI in the last 168 hours. BNP (last 3 results) No results for input(s): PROBNP in the last 8760 hours. HbA1C: No results for input(s): HGBA1C in the last 72 hours. CBG: No results for input(s): GLUCAP in the last 168 hours. Lipid Profile: No results for input(s): CHOL, HDL, LDLCALC, TRIG, CHOLHDL, LDLDIRECT in the last 72 hours. Thyroid Function Tests: No results for input(s): TSH, T4TOTAL, FREET4, T3FREE, THYROIDAB in the last 72 hours. Anemia Panel: No results for input(s): VITAMINB12, FOLATE, FERRITIN, TIBC, IRON, RETICCTPCT in the last 72 hours. Urine analysis:    Component Value Date/Time   COLORURINE YELLOW 08/10/2017 1820   APPEARANCEUR CLEAR 08/10/2017 1820   LABSPEC 1.017 08/10/2017 1820   PHURINE 5.0 08/10/2017 1820   GLUCOSEU NEGATIVE  08/10/2017 1820   HGBUR NEGATIVE 08/10/2017 1820   BILIRUBINUR NEGATIVE 08/10/2017 1820   KETONESUR NEGATIVE 08/10/2017 1820   PROTEINUR NEGATIVE 08/10/2017 1820   NITRITE NEGATIVE 08/10/2017 1820   LEUKOCYTESUR NEGATIVE 08/10/2017 1820   Sepsis Labs: @LABRCNTIP (procalcitonin:4,lacticidven:4) )No results found for this or any previous visit (from the past 240 hour(s)).   Radiological Exams on Admission: No results found.  EKG: Independently reviewed. Normal sinus rhythm.   Assessment/Plan  1. Cardiac arrhythmia, atrial flutter  - Pt was contacted by [employer], his cardiac monitor maker, directing him to ED for eval of an arrhythmia that they interpreted as ventricular tachycardia; cardiology has reviewed the strips and suspects this was actually atrial flutter with aberrant conduction   - He has suffered CVA's, making his CHADS-VASc at least 2  - Continue cardiac monitoring, Lopressor BID, and heparin infusion    2. Hx of CVA's  - Stable; likely secondary to a fib/flutter given the new findings on ambulatory monitor  - Continue Lipitor, ASA  81, and anticoagulation as above    3. Depression  - Stable - Continue Lexapro and Remeron    4. Renal insufficiency  - SCr is 1.26 on admission, consistent with his apparent baseline     DVT prophylaxis: IV heparin infusion  Code Status: Full  Family Communication: Discussed with patient Disposition Plan: Observe in SDU Consults called: Cardiology  Admission status: Observation    Briscoe Deutscher, MD [facility] Pager [phone]  If 7PM-7AM, please contact night-coverage [email or web address] Password [email or web address]  09/11/2017, 2:03 AM

## 2017-09-11 NOTE — Care Management Note (Addendum)
Case Management Note  Patient Details  Name: Charles Crawford MRN: [id number] Date of Birth: 27-Nov-1955  Subjective/Objective:    Adm from home with new onset Atrial Flutter. He is from home alone, nephew plans to stay with him 2-3 nights a week. He has other family nearby. Notes suggest patient may be prescribed Eliquis. Patient does not have insurance and reports he is not eligible for Medicaid. Discussed cost of Eliquis with patient and he will not be able to afford this. He is interested in coumadin if appropriate. Reports sister in law can take him to PT/ INR check appointments. Attending notified.            Action/Plan: Anticipate DC home with self care.   Expected Discharge Date:   09/12/2017             Expected Discharge Plan:  Home/Self Care  In-House Referral:     Discharge planning Services  CM Consult, Medication Assistance  Post Acute Care Choice:  NA Choice offered to:  NA  DME Arranged:    DME Agency:     HH Arranged:    HH Agency:     Status of Service:  Completed, signed off  If discussed at [employer] Length of Stay Meetings, dates discussed:    Additional Comments:  Anberlyn Feimster, Chrystine OilerSharley Diane, RN 09/11/2017, 12:24 PM

## 2017-09-12 DIAGNOSIS — Z8673 Personal history of transient ischemic attack (TIA), and cerebral infarction without residual deficits: Secondary | ICD-10-CM

## 2017-09-12 DIAGNOSIS — F329 Major depressive disorder, single episode, unspecified: Secondary | ICD-10-CM

## 2017-09-12 DIAGNOSIS — I472 Ventricular tachycardia: Secondary | ICD-10-CM

## 2017-09-12 DIAGNOSIS — I4892 Unspecified atrial flutter: Secondary | ICD-10-CM

## 2017-09-12 DIAGNOSIS — N289 Disorder of kidney and ureter, unspecified: Secondary | ICD-10-CM

## 2017-09-12 LAB — PROTIME-INR
INR: 1.04
Prothrombin Time: 13.5 seconds (ref 11.4–15.2)

## 2017-09-12 MED ORDER — METOPROLOL TARTRATE 25 MG PO TABS: 25.0000 mg | ORAL_TABLET | Freq: Two times a day (BID) | ORAL | 2 refills | Status: DC

## 2017-09-12 MED ORDER — PANTOPRAZOLE SODIUM 40 MG PO TBEC
40.0000 mg | DELAYED_RELEASE_TABLET | Freq: Two times a day (BID) | ORAL | Status: DC
  Administered 2017-09-12 (×2): 40 mg via ORAL
  Filled 2017-09-12 (×2): qty 1

## 2017-09-12 MED ORDER — WARFARIN SODIUM 7.5 MG PO TABS: 7.5000 mg | ORAL_TABLET | Freq: Once | ORAL | Status: DC

## 2017-09-12 MED ORDER — PANTOPRAZOLE SODIUM 40 MG PO TBEC: 40.0000 mg | DELAYED_RELEASE_TABLET | Freq: Two times a day (BID) | ORAL | 2 refills | Status: DC

## 2017-09-12 MED ORDER — APIXABAN 5 MG PO TABS
5.0000 mg | ORAL_TABLET | Freq: Two times a day (BID) | ORAL | Status: DC
  Administered 2017-09-12: 5 mg via ORAL
  Filled 2017-09-12: qty 1

## 2017-09-12 MED ORDER — APIXABAN 5 MG PO TABS: 5.0000 mg | ORAL_TABLET | Freq: Two times a day (BID) | ORAL | 0 refills | Status: DC

## 2017-09-12 NOTE — Progress Notes (Signed)
ANTICOAGULATION CONSULT NOTE   Pharmacy Consult for coumadin/lovenox Indication: Atrial fibrillation  No Known Allergies  Patient Measurements: Height: 5\' 11"  (180.3 cm) Weight: 160 lb 15 oz (73 kg) IBW/kg (Calculated) : 75.3 HEPARIN DW (KG): 73   Vital Signs: Temp: 97.7 F (36.5 C) (11/03 0353) Temp Source: Oral (11/03 0353) BP: 108/80 (11/03 0800) Pulse Rate: 56 (11/03 0824)  Labs:  Recent Labs  09/10/17 2326 09/11/17 0415 09/11/17 0838 09/11/17 1324 09/12/17 0413  HGB 13.9 13.3  --   --   --   HCT 42.2 40.5  --   --   --   PLT 219 233  --   --   --   APTT 28  --   --   --   --   LABPROT 12.7  --   --   --  13.5  INR 0.96  --   --   --  1.04  HEPARINUNFRC  --   --  1.30*  --   --   CREATININE 1.26* 1.12  --   --   --   TROPONINI  --  <0.03 <0.03 <0.03  --    Estimated Creatinine Clearance: 71.5 mL/min (by C-G formula based on SCr of 1.12 mg/dL).  Medical History: Past Medical History:  Diagnosis Date  . CVA (cerebral vascular accident) (HCC)   . Depression   . Hyperlipidemia   . Hypertension   . Renal insufficiency   . Tobacco abuse     Medications:    Assessment: 61 yo male on home cardiac monitoring seen in the ED for evaluation due to reported abnormalities seen by his heart monitoring service. Cardiology consulted and recommended heparin infusion.  He received one dose of eliquis earlier today.  He is not able to afford eliquis so he will be changed to coumadin with lovenox bridge INR today 1.04  Goal of Therapy:  INR 2-3 Monitor platelets by anticoagulation protocol: Yes   Plan:  Coumadin 7.5 mg po today.  Cont lovenox 1mg /kg sq q12 hours Daily PT/INR Monitor for bleeding complications F/u coumadin education  [name], Elke Holtry Poteet, RPH 09/12/2017,8:53 AM

## 2017-09-12 NOTE — Discharge Summary (Addendum)
Physician Discharge Summary  Charles Crawford:[id number] DOB: 12/21/1955 DOA: 09/10/2017  PCP: Wilson Singer, MD  Admit date: 09/10/2017 Discharge date: 09/12/2017  Admitted From: Home Disposition:   Home  Recommendations for Outpatient Follow-up:  1. Follow up with PCP in 1-2 weeks 2. Follow up with cardiology within 3 weeks 3. Schedule follow up with Dr. Darrick Penna 4. Take PPI twice a day 5. Avoid straws, carbonated beverages and chewing gum 6. Please obtain BMP/CBC in one week   Home Health: No  Equipment/Devices: None  Discharge Condition: Stable CODE STATUS: Full Code  Diet recommendation: Heart Healthy  Brief/Interim Summary: Charles Crawford is a 61 y.o. male with medical history significant for depression with anxiety and ischemic strokes, now presenting to the emergency department after being notified that there was an arrhythmia his ambulatory heart monitor.  Patient had been admitted to the hospital about a month ago with slurred speech, was found to have an acute ischemic stroke, and was also noted on imaging suffered prior strokes.  Patient was in a sinus rhythm at that time and TEE normal EF and no thrombus.  He was discharged home with a 30-day ambulatory cardiac monitor.  He had been in his usual state of health and was asleep when he was called by the monitoring device company, notifying him that an arrhythmia had been detected (interpreted by the device as 37 seconds of ventricular tachycardia), and directed him to the emergency department.  Patient believes that he may have been feeling some palpitations around that time, but has otherwise been well.  ED Course: Upon arrival to the ED, patient is found to be afebrile, saturating well on room air, and with vitals otherwise stable.  EKG features normal sinus rhythm, chemistry panel reveals a chronic mild renal insufficiency with creatinine 1.26, and CBC is patient's cardiologist had also been alerted to the arrhythmia and  reviewed the rhythm strips, suspecting that this was an episode of atrial flutter with aberrant conduction.  She discussed the case with the ED physician and recommended medical admission with initiation of Lopressor and heparin infusion.  Patient was given a dose of oral Lopressor, started on heparin infusion, remained hemodynamically stable and in a normal sinus rhythm, stepdown unit for ongoing evaluation and management.  Discharge Diagnoses:  Principal Problem:   New onset atrial flutter (HCC) Active Problems:   Renal insufficiency, mild   History of CVA (cerebrovascular accident)   Depression   Cardiac arrhythmia    Discharge Instructions  Discharge Instructions    Call MD for:  difficulty breathing, headache or visual disturbances    Complete by:  As directed    Call MD for:  difficulty breathing, headache or visual disturbances    Complete by:  As directed    Call MD for:  extreme fatigue    Complete by:  As directed    Call MD for:  extreme fatigue    Complete by:  As directed    Call MD for:  hives    Complete by:  As directed    Call MD for:  hives    Complete by:  As directed    Call MD for:  persistant dizziness or light-headedness    Complete by:  As directed    Call MD for:  persistant dizziness or light-headedness    Complete by:  As directed    Call MD for:  persistant nausea and vomiting    Complete by:  As directed  Call MD for:  persistant nausea and vomiting    Complete by:  As directed    Call MD for:  redness, tenderness, or signs of infection (pain, swelling, redness, odor or green/yellow discharge around incision site)    Complete by:  As directed    Call MD for:  redness, tenderness, or signs of infection (pain, swelling, redness, odor or green/yellow discharge around incision site)    Complete by:  As directed    Call MD for:  severe uncontrolled pain    Complete by:  As directed    Call MD for:  severe uncontrolled pain    Complete by:  As  directed    Call MD for:  temperature >100.4    Complete by:  As directed    Call MD for:  temperature >100.4    Complete by:  As directed    Diet - low sodium heart healthy    Complete by:  As directed    Diet - low sodium heart healthy    Complete by:  As directed    Discharge instructions    Complete by:  As directed    Take medications as prescribed Follow up with providers listed at earliest convenience Consider EGD and TCS   Discharge instructions    Complete by:  As directed    Call Cardiology clinic on Monday to discuss need for another prescription for heart monitor Schedule follow up with Dr. Darrick PennaFields for EGD and Colonoscopy PPI twice a day Avoid carbonated beverages, straws and chewing gum Eliquis twice a day Discuss with cardiology samples of eliquis   Increase activity slowly    Complete by:  As directed    Increase activity slowly    Complete by:  As directed      Allergies as of 09/12/2017   No Known Allergies     Medication List    STOP taking these medications   aspirin 325 MG tablet     TAKE these medications   ALPRAZolam 0.5 MG tablet Commonly known as:  XANAX Take 0.5 mg by mouth 2 (two) times daily as needed for anxiety or sleep.   apixaban 5 MG Tabs tablet Commonly known as:  ELIQUIS Take 1 tablet (5 mg total) by mouth 2 (two) times daily.   atorvastatin 40 MG tablet Commonly known as:  LIPITOR Take 1 tablet (40 mg total) by mouth daily at 6 PM.   escitalopram 20 MG tablet Commonly known as:  LEXAPRO Take 20 mg by mouth daily.   metoprolol tartrate 25 MG tablet Commonly known as:  LOPRESSOR Take 1 tablet (25 mg total) by mouth 2 (two) times daily.   mirtazapine 15 MG tablet Commonly known as:  REMERON Take 45 mg by mouth at bedtime.   pantoprazole 40 MG tablet Commonly known as:  PROTONIX Take 1 tablet (40 mg total) by mouth 2 (two) times daily before a meal.      Follow-up Information    Gosrani, Nimish C, MD. Schedule an  appointment as soon as possible for a visit in 1 week(s).   Specialty:  Internal Medicine Contact information: [address] [place] [phone]        West BaliFields, Sandi L, MD. Schedule an appointment as soon as possible for a visit in 2 week(s).   Specialty:  Gastroenterology Contact information: [address] [place] [address] [phone]        Antoine PocheBranch, Jonathan F, MD. Schedule an appointment as soon as possible  for a visit in 2 week(s).   Specialty:  Cardiology Contact information: [address] [place] [address] [phone]          No Known Allergies  Consultations:  Gastroenterology  Cardiology   Procedures/Studies: No results found.     Subjective: Patient seen this am.  He says he has had numerous episodes of burping overnight but otherwise reports he feels well.  Few episodes of lower blood pressures overnight.  Heart rate well controlled.  Patient sister in law reports that she will attempt to get samples from cardiology after discharge.  Discharge Exam: Vitals:   09/12/17 1400 09/12/17 1500  BP: 104/76 108/72  Pulse: 68 63  Resp: 13 13  Temp:    SpO2: 97% 97%   Vitals:   09/12/17 1200 09/12/17 1300 09/12/17 1400 09/12/17 1500  BP: 118/72 99/75 104/76 108/72  Pulse: 75 76 68 63  Resp: 17 13 13 13   Temp:      TempSrc:      SpO2: 96% 97% 97% 97%  Weight:      Height:        General: Pt is alert, awake, not in acute distress Cardiovascular: RRR, S1/S2 +, no rubs, no gallops, I/VI systolic murmur Respiratory: CTA bilaterally, no wheezing, no rhonchi Abdominal: Soft, NT, ND, bowel sounds + Extremities: no edema, no cyanosis    The results of significant diagnostics from this hospitalization (including imaging, microbiology, ancillary and laboratory) are listed below for reference.     Microbiology: Recent Results (from the past 240 hour(s))  MRSA PCR Screening     Status: None   Collection Time: 09/11/17   3:15 AM  Result Value Ref Range Status   MRSA by PCR NEGATIVE NEGATIVE Final    Comment:        The GeneXpert MRSA Assay (FDA approved for NASAL specimens only), is one component of a comprehensive MRSA colonization surveillance program. It is not intended to diagnose MRSA infection nor to guide or monitor treatment for MRSA infections.      Labs: BNP (last 3 results) No results for input(s): BNP in the last 8760 hours. Basic Metabolic Panel:  Recent Labs Lab 09/10/17 2326 09/11/17 0415  NA 143 140  K 4.1 3.9  CL 104 106  CO2 30 26  GLUCOSE 78 101*  BUN 21* 21*  CREATININE 1.26* 1.12  CALCIUM 9.1 8.7*  MG 2.2  --    Liver Function Tests:  Recent Labs Lab 09/10/17 2326  AST 22  ALT 17  ALKPHOS 87  BILITOT 0.4  PROT 7.0  ALBUMIN 3.9   No results for input(s): LIPASE, AMYLASE in the last 168 hours. No results for input(s): AMMONIA in the last 168 hours. CBC:  Recent Labs Lab 09/10/17 2326 09/11/17 0415  WBC 5.8 6.1  NEUTROABS 2.4  --   HGB 13.9 13.3  HCT 42.2 40.5  MCV 94.8 94.8  PLT 219 233   Cardiac Enzymes:  Recent Labs Lab 09/11/17 0415 09/11/17 0838 09/11/17 1324  TROPONINI <0.03 <0.03 <0.03   BNP: Invalid input(s): POCBNP CBG: No results for input(s): GLUCAP in the last 168 hours. D-Dimer No results for input(s): DDIMER in the last 72 hours. Hgb A1c No results for input(s): HGBA1C in the last 72 hours. Lipid Profile No results for input(s): CHOL, HDL, LDLCALC, TRIG, CHOLHDL, LDLDIRECT in the last 72 hours. Thyroid function studies No results for input(s): TSH, T4TOTAL, T3FREE, THYROIDAB in the last 72 hours.  Invalid input(s):  FREET3 Anemia work up No results for input(s): VITAMINB12, FOLATE, FERRITIN, TIBC, IRON, RETICCTPCT in the last 72 hours. Urinalysis    Component Value Date/Time   COLORURINE YELLOW 08/10/2017 1820   APPEARANCEUR CLEAR 08/10/2017 1820   LABSPEC 1.017 08/10/2017 1820   PHURINE 5.0 08/10/2017 1820    GLUCOSEU NEGATIVE 08/10/2017 1820   HGBUR NEGATIVE 08/10/2017 1820   BILIRUBINUR NEGATIVE 08/10/2017 1820   KETONESUR NEGATIVE 08/10/2017 1820   PROTEINUR NEGATIVE 08/10/2017 1820   NITRITE NEGATIVE 08/10/2017 1820   LEUKOCYTESUR NEGATIVE 08/10/2017 1820   Sepsis Labs Invalid input(s): PROCALCITONIN,  WBC,  LACTICIDVEN Microbiology Recent Results (from the past 240 hour(s))  MRSA PCR Screening     Status: None   Collection Time: 09/11/17  3:15 AM  Result Value Ref Range Status   MRSA by PCR NEGATIVE NEGATIVE Final    Comment:        The GeneXpert MRSA Assay (FDA approved for NASAL specimens only), is one component of a comprehensive MRSA colonization surveillance program. It is not intended to diagnose MRSA infection nor to guide or monitor treatment for MRSA infections.      Time coordinating discharge: 40 minutes  SIGNED:   Katrinka Blazing, MD  [facility] 09/12/2017, 4:50 PM Pager [phone] If 7PM-7AM, please contact night-coverage [email or web address] Password [email or web address]

## 2017-09-12 NOTE — Plan of Care (Signed)
Problem: Safety: Goal: Ability to remain free from injury will improve Outcome: Progressing Patients bed is in the lowest position. Call light is within reach. Encouraged the patient to call for assistance.

## 2017-09-12 NOTE — Consult Note (Addendum)
Referring Provider: No ref. provider found Primary Care Physician:  Wilson SingerGosrani, Nimish C, MD Primary Gastroenterologist:  Jonette EvaSandi Jennell Janosik  Reason for Consultation:  BURPING   Impression: ADMITTED WITH IRREGULAR HEART RHYTHYM ASSOCIATED WITH BURPING. PT FEELS LIKE IT MAY BE ANXIETY AND WORSE WITH STROKES. AEROPHAGIA MAY BE DUE TO ANXIETY, GERD, CNS EVENT(NON-EPILEPTIC PSYCHOMOTOR DISORDER), AND LESS LIKELY CARDIAC ETIOLOGY.  Plan: 1. SUPPORTIVE CARE 2. NEEDS OUTPATIENT TCS. 3. CONSIDER EGD IF BURPING DOES NOT IMPROVE. OTHERWISE HE CAN HAVE EGD/TCS AS AN OUTPATIENT FOR COLON CANCER AND BARRETT'S ESOPHAGUS SCREENING. 4. PROTONIX BID TO PREVENT ULCERS/GASTRITIS IN SETTING OF ASA AND COUMADIN USE AND MINIMIZE ACID REFLUX AS A CONTRIBUTOR TO THE AEROPHAGIA. 5. CONTINUE TO MONITOR SYMPTOMS. 6. NO STRAWS, CARBONATED BEVERAGES OR CHEWING GUM.      HPI:  ADMITTED WITH RECENT ISTORY OF WVPXTGG(2-6STROKES(4-5). BURPING INCESSANTLY ALSO OCCURRED AROUND THE TIME HE HAD ALL THE BURPING. IT SEEMED TO BE WORSE WHEN HE LAID DOWN. 50-60 EPISODE THAT NIGHT AND LAST NIGHT 10-15 TIMES. OCCURS MOSTLY AT NIGHT TIME AND WHEN HE'S LAYING DOWN. HEARTBURN ASSOCIATED WITH BURPING. BEEN EATING WELL. COFFEE KEEPS HIM REGULAR. NO TROUBLE WITH CONSTIPATION IN THE PAST FEW DAYS. HAPPENS AT HOME SOMETIMES. FEELS LIKE IT MAY BE DUE TO ANXIETY(OVERWHELEMD AND WITH STROKES) BEFORE HAD STROKES HAD THIS PROBLEM AND EVALUATED BY FAMILY DOCTOR YEARS AGO PUT HIM ON PEPCID YEARS AGO. DR. Gerilyn PilgrimONQUAH HAS HIM ON Prudy FeelerXANAX FOR ANXIETY. Wife passed in APR 2018.   PT DENIES FEVER, CHILLS, HEMATOCHEZIA, HEMATEMESIS, nausea, vomiting, melena, diarrhea, CHEST PAIN, SHORTNESS OF BREATH,  CHANGE IN BOWEL IN HABITS, abdominal pain, OR problems swallowing. LAST TCS ~10 YRS AGO.     Past Medical History:  Diagnosis Date  . CVA (cerebral vascular accident) (HCC)   . Depression   . Hyperlipidemia   . Hypertension   . Renal insufficiency   . Tobacco abuse      Past Surgical History:  Procedure Laterality Date  . APPENDECTOMY    . BACK SURGERY    . TEE WITHOUT CARDIOVERSION N/A 08/11/2017   Procedure: TRANSESOPHAGEAL ECHOCARDIOGRAM (TEE);  Surgeon: Laqueta LindenKoneswaran, Suresh A, MD;  Location: [facility];  Service: Cardiovascular;  Laterality: N/A;    Prior to Admission medications   Medication Sig Start Date End Date Taking? Authorizing Provider  ALPRAZolam Prudy Feeler(XANAX) 0.5 MG tablet Take 0.5 mg by mouth 2 (two) times daily as needed for anxiety or sleep.   Yes [provider]  aspirin 325 MG tablet Take 325 mg by mouth daily.   Yes [provider]  atorvastatin (LIPITOR) 40 MG tablet Take 1 tablet (40 mg total) by mouth daily at 6 PM. 08/12/17  Yes Philip AspenHernandez Acosta, Limmie PatriciaEstela Y, MD  escitalopram (LEXAPRO) 20 MG tablet Take 20 mg by mouth daily.   Yes [provider]  mirtazapine (REMERON) 15 MG tablet Take 45 mg by mouth at bedtime.    Yes [provider]    Current Facility-Administered Medications  Medication Dose Route Frequency Provider Last Rate Last Dose  . acetaminophen (TYLENOL) tablet 650 mg  650 mg Oral Q4H PRN     . ALPRAZolam (XANAX) tablet 0.25 mg  0.25 mg Oral BID PRN     . alum & mag hydroxide-simeth (MAALOX/MYLANTA) 200-200-20 MG/5ML suspension 15 mL  15 mL Oral Q4H PRN     . aspirin EC tablet 81 mg  81 mg Oral Daily     . atorvastatin (LIPITOR) tablet 40 mg  40 mg Oral q1800     .  coumadin book   Does not apply Once     . enoxaparin (LOVENOX) injection 75 mg  75 mg Subcutaneous Q12H     . escitalopram (LEXAPRO) tablet 20 mg  20 mg Oral Daily     . metoprolol tartrate (LOPRESSOR) tablet 25 mg  25 mg Oral BID     . mirtazapine (REMERON) tablet 45 mg  45 mg Oral QHS     . ondansetron (ZOFRAN) injection 4 mg  4 mg Intravenous Q6H PRN     . warfarin (COUMADIN) tablet 7.5 mg  7.5 mg Oral Once     . Warfarin - Pharmacist Dosing Inpatient   Does not apply q1800       Allergies as of 09/10/2017  . (No  Known Allergies)    Family History  Problem Relation Age of Onset  . CVA Mother   . CAD Father   . Heart attack Father   . Hypertension Sister   . CVA Maternal Grandmother   . CVA Maternal Grandfather   . Heart attack Paternal Grandmother      Social History   Social History  . Marital status: Widowed    Spouse name: N/A  . Number of children: N/A  . Years of education: N/A   Occupational History  . Not on file.   Social History Main Topics  . Smoking status: Former Games developer  . Smokeless tobacco: Never Used  . Alcohol use No     Comment: former heavy drinker  . Drug use: No  . Sexual activity: Not on file   Other Topics Concern  . Not on file   Social History Narrative  . No narrative on file    Review of Systems: PER HPI OTHERWISE ALL SYSTEMS ARE NEGATIVE.   Vitals: Blood pressure (!) 88/63, pulse (!) 59, temperature 97.7 F (36.5 C), temperature source Oral, resp. rate 11, height 5\' 11"  (1.803 m), weight 160 lb 15 oz (73 kg), SpO2 96 %.  Physical Exam: General:   Alert,  Well-developed, well-nourished, pleasant and cooperative in NAD. OCCASIONALLY BURPS DURING INTERVIEW Head:  Normocephalic and atraumatic. Eyes:  Sclera clear, no icterus.    Mouth:  No lesions, dentition ABnormal. Neck:  Supple; no masses. Lungs:  Clear throughout to auscultation.   No wheezes. No acute distress. Heart:  Regular rate and rhythm; no murmurs. Abdomen:  Soft, nontender and nondistended. No masses noted. Normal bowel sounds, without guarding, and without rebound.   Msk:  Symmetrical without gross deformities. Normal posture. Extremities:  Without edema. Neurologic:  Alert and  oriented x4;  NO  NEW FOCAL DEFICITS Cervical Nodes:  No significant cervical adenopathy. Psych:  Alert and cooperative. Normal mood and affect.   Lab Results:  Recent Labs  09/10/17 2326 09/11/17 0415  WBC 5.8 6.1  HGB 13.9 13.3  HCT 42.2 40.5  PLT 219 233   BMET  Recent Labs   09/10/17 2326 09/11/17 0415  NA 143 140  K 4.1 3.9  CL 104 106  CO2 30 26  GLUCOSE 78 101*  BUN 21* 21*  CREATININE 1.26* 1.12  CALCIUM 9.1 8.7*   LFT  Recent Labs  09/10/17 2326  PROT 7.0  ALBUMIN 3.9  AST 22  ALT 17  ALKPHOS 87  BILITOT 0.4     Studies/Results: SEP 2018: CT ANGIO HEAD-NO ACUTE STROKE   LOS: 0 days   Zayah Keilman  09/12/2017, 9:41 AM

## 2017-09-12 NOTE — Progress Notes (Signed)
Patient discharged to home taken to care via wheelchair.  Family and patient verbalized understanding of new medications to be taken at home.

## 2017-09-21 ENCOUNTER — Telehealth: Payer: Self-pay | Admitting: Adult Health

## 2017-09-21 NOTE — Telephone Encounter (Signed)
Per phone call from pt's sister Shelah LewandowskyCheri Wiers POA --pt is needing a new order for his heart monitor sent to [facility]. Dr. Wyline MoodBranch is wanting him to continue wearing it for another 30 days, the order he has now expires on 09/24/17. They also need assistance for his Eliquis. Dennard NipCheri would like someone to give her a call @ [phone]

## 2017-09-22 NOTE — Telephone Encounter (Signed)
Per Dr. Wyline MoodBranch, patient does not need an extended monitor.

## 2017-09-22 NOTE — Telephone Encounter (Signed)
Please clarify

## 2017-09-23 ENCOUNTER — Encounter: Payer: Self-pay | Admitting: Gastroenterology

## 2017-09-24 NOTE — Telephone Encounter (Signed)
Wife notified that patient does not need an extended event monitor, she was ok with this

## 2017-09-24 NOTE — Telephone Encounter (Signed)
Left message for Shelah LewandowskyCheri Chalk to return my call

## 2017-10-04 NOTE — Progress Notes (Signed)
Cardiology Office Note   Date:  10/05/2017   ID:  Charles SarDavid A Maffett, DOB 1956/02/25, MRN [id number]  PCP:  Wilson SingerGosrani, Nimish C, MD  Cardiologist: Purvis SheffieldKoneswaran (Only did TEE)    History of Present Illness: Charles Crawford is a 61 y.o. male who presents for post hospital follow up after admission for acute CVA, renal insufficiency, hyperlipidemia, with other hx of depression and anxiety. He was seen on consultation on 09/11/2017 after finding of wide complex tachycardia with atrial flutter. He was started on Eliquis 5 mg BID, metoprolol 25 mg BID, and was to continue to wear heart monitor.  Report read by Dr. Wyline MoodBranch: indicated isolated episode of aflutter with aberrance on 09/10/17, no arrhytmia recurrence since that time   Since returning home from the hospital, the patient has had no recurrence of headache, dizziness, rapid heart rhythm, bleeding, melena, or excessive bruising.  He has some episodes of depression, and some confusion.  He is being followed by neurology and by primary care.  He is not yet seen psychiatry which may be helpful concerning his antidepressant medication at the discretion of his primary care.  He is chosen to stop driving.  His daughter-in-law who is with him helps him prepare his medications, and his nephew helps him with ADLs if necessary along with groceries and housecleaning.  Past Medical History:  Diagnosis Date  . CVA (cerebral vascular accident) (HCC)   . Depression   . Hyperlipidemia   . Hypertension   . Renal insufficiency   . Tobacco abuse     Past Surgical History:  Procedure Laterality Date  . APPENDECTOMY    . BACK SURGERY    . TEE WITHOUT CARDIOVERSION N/A 08/11/2017   Procedure: TRANSESOPHAGEAL ECHOCARDIOGRAM (TEE);  Surgeon: Laqueta LindenKoneswaran, Suresh A, MD;  Location: [facility];  Service: Cardiovascular;  Laterality: N/A;     Current Outpatient Medications  Medication Sig Dispense Refill  . ALPRAZolam (XANAX) 0.5 MG tablet Take 0.5 mg by mouth 2  (two) times daily as needed for anxiety or sleep.    Marland Kitchen. apixaban (ELIQUIS) 5 MG TABS tablet Take 1 tablet (5 mg total) by mouth 2 (two) times daily. 60 tablet 0  . atorvastatin (LIPITOR) 40 MG tablet Take 1 tablet (40 mg total) by mouth daily at 6 PM. 90 tablet 2  . escitalopram (LEXAPRO) 20 MG tablet Take 20 mg by mouth daily.    . metoprolol tartrate (LOPRESSOR) 25 MG tablet Take 1 tablet (25 mg total) by mouth 2 (two) times daily. 180 tablet 2  . mirtazapine (REMERON) 15 MG tablet Take 45 mg by mouth at bedtime.     . pantoprazole (PROTONIX) 40 MG tablet Take 1 tablet (40 mg total) by mouth 2 (two) times daily before a meal. 180 tablet 2   No current facility-administered medications for this visit.     Allergies:   Patient has no known allergies.    Social History:  The patient  reports that he quit smoking about 6 years ago. he has never used smokeless tobacco. He reports that he does not drink alcohol or use drugs.   Family History:  The patient's family history includes CAD in his father; CVA in his maternal grandfather, maternal grandmother, and mother; Heart attack in his father and paternal grandmother; Hypertension in his sister.    ROS: All other systems are reviewed and negative. Unless otherwise mentioned in H&P    PHYSICAL EXAM: VS:  BP 124/68 (BP Location: Left Arm)   Pulse  80   Ht 5\' 10"  (1.778 m)   Wt 173 lb (78.5 kg)   SpO2 95%   BMI 24.82 kg/m  , BMI Body mass index is 24.82 kg/m. GEN: Well nourished, well developed, in no acute distress  HEENT: normal  Neck: no JVD, carotid bruits, or masses Cardiac: RRR; no murmurs, rubs, or gallops,no edema  Respiratory:  clear to auscultation bilaterally, normal work of breathing GI: soft, nontender, nondistended, + BS MS: no deformity or atrophy  Skin: warm and dry, no rash Neuro:  Strength and sensation are intact Psych: euthymic mood, full affect   Recent Labs: 08/10/2017: TSH 1.786 09/10/2017: ALT 17; Magnesium  2.2 09/11/2017: BUN 21; Creatinine, Ser 1.12; Hemoglobin 13.3; Platelets 233; Potassium 3.9; Sodium 140    Lipid Panel    Component Value Date/Time   CHOL 197 08/08/2017 0611   TRIG 70 08/08/2017 0611   HDL 46 08/08/2017 0611   CHOLHDL 4.3 08/08/2017 0611   VLDL 14 08/08/2017 0611   LDLCALC 137 (H) 08/08/2017 0611      Wt Readings from Last 3 Encounters:  10/05/17 173 lb (78.5 kg)  09/11/17 160 lb 15 oz (73 kg)  08/07/17 154 lb 14.4 oz (70.3 kg)      Other studies Reviewed: Echocardiogram 08/08/2017 - Left ventricle: The cavity size was normal. Wall thickness was   normal. Systolic function was normal. The estimated ejection   fraction was in the range of 60% to 65%. Wall motion was normal;   there were no regional wall motion abnormalities. Doppler   parameters are consistent with abnormal left ventricular   relaxation (grade 1 diastolic dysfunction). - Aortic valve: Valve area (VTI): 2.67 cm^2. Valve area (Vmax):   3.02 cm^2. Valve area (Vmean): 2.39 cm^2. - Technically adequate study.    ASSESSMENT AND PLAN:  1.  Paroxysmal atrial flutter: Cardiac monitor did not document any further episodes of atrial flutter, wide-complex tachycardia or aberrancy since recent discharge.  He continues on metoprolol, apixaban 5 mg twice daily, and is tolerating the medication without consequence.  He is requesting assistance with Eliquis as he is unable to afford this medication.  I have given him samples of Eliquis along with paperwork which she will need to fill out to help with medication assistance.  He is given refills on metoprolol and atorvastatin.  I am checking a CBC and a magnesium.  2.  Hypertension: Currently well controlled.  No changes in his medication regimen.  3.  Hypercholesterolemia: Continue statin therapy.  Will need to have follow-up labs in 6 months.  4.  Depression: Daughter-in-law says that if he is having worsening episodes of depression with crying and  isolation at times.  Thighs that he may need to see psychiatry for adjustments in medications which he is taking for depression which may be helpful in the setting of TIAs.  He may need referral from PCP.  I have advised that he call them to find out for sure.  No changes in any of his psychotropic medications will be changed or adjusted by cardiology.  5.  GERD: Currently on Protonix.  No further complaints.  Checking magnesium.   Current medicines are reviewed at length with the patient today.    Labs/ tests ordered today include: CBC, magnesium.    Bettey MareKathryn M. Liborio NixonLawrence DNP, ANP, AACC   10/05/2017 2:34 PM    [facility] [address], [place], [place] [address] Phone: [phone]; Fax: [phone]

## 2017-10-05 ENCOUNTER — Ambulatory Visit (INDEPENDENT_AMBULATORY_CARE_PROVIDER_SITE_OTHER): Payer: Self-pay | Admitting: Adult Health

## 2017-10-05 ENCOUNTER — Other Ambulatory Visit (HOSPITAL_COMMUNITY)
Admission: RE | Admit: 2017-10-05 | Discharge: 2017-10-05 | Disposition: A | Discharge status: Home / Self Care | Payer: Self-pay | Source: Ambulatory Visit | Attending: Adult Health | Admitting: Adult Health

## 2017-10-05 ENCOUNTER — Encounter: Payer: Self-pay | Admitting: Adult Health

## 2017-10-05 VITALS — BP 124/68 | HR 80 | Ht 70.0 in | Wt 173.0 lb

## 2017-10-05 DIAGNOSIS — Z79899 Other long term (current) drug therapy: Secondary | ICD-10-CM

## 2017-10-05 DIAGNOSIS — I480 Paroxysmal atrial fibrillation: Secondary | ICD-10-CM

## 2017-10-05 DIAGNOSIS — F329 Major depressive disorder, single episode, unspecified: Secondary | ICD-10-CM

## 2017-10-05 DIAGNOSIS — F32A Depression, unspecified: Secondary | ICD-10-CM

## 2017-10-05 DIAGNOSIS — I10 Essential (primary) hypertension: Secondary | ICD-10-CM

## 2017-10-05 DIAGNOSIS — K219 Gastro-esophageal reflux disease without esophagitis: Secondary | ICD-10-CM

## 2017-10-05 LAB — CBC WITH DIFFERENTIAL/PLATELET
BASOS ABS: 0 10*3/uL (ref 0.0–0.1)
Basophils Relative: 0 %
Eosinophils Absolute: 0 10*3/uL (ref 0.0–0.7)
Eosinophils Relative: 1 %
HEMATOCRIT: 41.9 % (ref 39.0–52.0)
HEMOGLOBIN: 13.3 g/dL (ref 13.0–17.0)
LYMPHS ABS: 1.7 10*3/uL (ref 0.7–4.0)
LYMPHS PCT: 27 %
MCH: 30.9 pg (ref 26.0–34.0)
MCHC: 31.7 g/dL (ref 30.0–36.0)
MCV: 97.4 fL (ref 78.0–100.0)
Monocytes Absolute: 0.5 10*3/uL (ref 0.1–1.0)
Monocytes Relative: 8 %
NEUTROS ABS: 4.1 10*3/uL (ref 1.7–7.7)
NEUTROS PCT: 64 %
PLATELETS: 260 10*3/uL (ref 150–400)
RBC: 4.3 MIL/uL (ref 4.22–5.81)
RDW: 12.8 % (ref 11.5–15.5)
WBC: 6.3 10*3/uL (ref 4.0–10.5)

## 2017-10-05 LAB — MAGNESIUM: Magnesium: 2.1 mg/dL (ref 1.7–2.4)

## 2017-10-05 MED ORDER — PANTOPRAZOLE SODIUM 40 MG PO TBEC: 40.0000 mg | DELAYED_RELEASE_TABLET | Freq: Two times a day (BID) | ORAL | 2 refills | Status: AC

## 2017-10-05 MED ORDER — METOPROLOL TARTRATE 25 MG PO TABS: 25.0000 mg | ORAL_TABLET | Freq: Two times a day (BID) | ORAL | 2 refills | Status: AC

## 2017-10-05 MED ORDER — ATORVASTATIN CALCIUM 40 MG PO TABS: 40.0000 mg | ORAL_TABLET | Freq: Every day | ORAL | 2 refills | Status: DC

## 2017-10-05 MED ORDER — ATORVASTATIN CALCIUM 40 MG PO TABS: 40.0000 mg | ORAL_TABLET | Freq: Every day | ORAL | 2 refills | Status: AC

## 2017-10-05 NOTE — Patient Instructions (Signed)
Medication Instructions:  Your physician recommends that you continue on your current medications as directed. Please refer to the Current Medication list given to you today.   I HAVE REFILLED MEDICATIONS   Labwork: TODAY CBC MAGNESIUM   Testing/Procedures: NONE  Follow-Up: Your physician recommends that you schedule a follow-up appointment in: 4 MONTHS    Any Other Special Instructions Will Be Listed Below (If Applicable).  PLEASE FILL OUT THE PATIENT ASSISTANCE FORM AND BRING PROOF OF INCOME SO THAT WE MAY ASSIST YOU WITH THE ELIQUIS   If you need a refill on your cardiac medications before your next appointment, please call your pharmacy.

## 2017-10-06 ENCOUNTER — Telehealth: Payer: Self-pay | Admitting: *Deleted

## 2017-10-06 NOTE — Telephone Encounter (Signed)
-----   Message from Jodelle GrossKathryn M Lawrence, NP sent at 10/05/2017  3:11 PM EST ----- No evidence of anemia. CBC is normal. Awaiting Magnesium level

## 2017-10-06 NOTE — Telephone Encounter (Signed)
Called patient with test results. No answer. Left message to call back.  

## 2017-10-09 ENCOUNTER — Ambulatory Visit: Payer: Self-pay | Admitting: Adult Health

## 2017-11-11 ENCOUNTER — Telehealth: Payer: Self-pay | Admitting: Adult Health

## 2017-11-11 NOTE — Telephone Encounter (Signed)
Requesting samples of Eliquis / tg  °

## 2017-11-11 NOTE — Telephone Encounter (Signed)
Returned pt call. No answer, voicemail box is full. I was unable to leave message.

## 2017-11-12 ENCOUNTER — Encounter: Payer: Self-pay | Admitting: Cardiovascular Disease

## 2017-11-12 ENCOUNTER — Encounter: Payer: Self-pay | Admitting: Adult Health

## 2017-11-12 ENCOUNTER — Other Ambulatory Visit: Payer: Self-pay | Admitting: *Deleted

## 2017-11-12 MED ORDER — APIXABAN 5 MG PO TABS: 5.0000 mg | ORAL_TABLET | Freq: Two times a day (BID) | ORAL | 11 refills | Status: AC

## 2017-11-18 ENCOUNTER — Ambulatory Visit: Payer: Self-pay | Admitting: Gastroenterology

## 2017-11-27 ENCOUNTER — Encounter: Payer: Self-pay | Admitting: Cardiovascular Disease
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
## 2017-12-15 ENCOUNTER — Encounter (HOSPITAL_COMMUNITY): Payer: Self-pay | Admitting: Psychiatry

## 2017-12-15 ENCOUNTER — Telehealth (HOSPITAL_COMMUNITY): Payer: Self-pay | Admitting: *Deleted

## 2017-12-15 ENCOUNTER — Ambulatory Visit (INDEPENDENT_AMBULATORY_CARE_PROVIDER_SITE_OTHER): Payer: BLUE CROSS/BLUE SHIELD | Admitting: Psychiatry

## 2017-12-15 VITALS — BP 104/70 | HR 57 | Ht 67.0 in | Wt 183.0 lb

## 2017-12-15 DIAGNOSIS — F1021 Alcohol dependence, in remission: Secondary | ICD-10-CM | POA: Diagnosis not present

## 2017-12-15 DIAGNOSIS — I480 Paroxysmal atrial fibrillation: Secondary | ICD-10-CM | POA: Diagnosis not present

## 2017-12-15 DIAGNOSIS — Z811 Family history of alcohol abuse and dependence: Secondary | ICD-10-CM

## 2017-12-15 DIAGNOSIS — F419 Anxiety disorder, unspecified: Secondary | ICD-10-CM

## 2017-12-15 DIAGNOSIS — Z818 Family history of other mental and behavioral disorders: Secondary | ICD-10-CM

## 2017-12-15 DIAGNOSIS — F331 Major depressive disorder, recurrent, moderate: Secondary | ICD-10-CM

## 2017-12-15 DIAGNOSIS — Z87891 Personal history of nicotine dependence: Secondary | ICD-10-CM

## 2017-12-15 DIAGNOSIS — I69311 Memory deficit following cerebral infarction: Secondary | ICD-10-CM | POA: Diagnosis not present

## 2017-12-15 DIAGNOSIS — Z79899 Other long term (current) drug therapy: Secondary | ICD-10-CM

## 2017-12-15 MED ORDER — TRAZODONE HCL 50 MG PO TABS: ORAL_TABLET | ORAL | 1 refills | Status: DC

## 2017-12-15 MED ORDER — MIRTAZAPINE 45 MG PO TABS: 45.0000 mg | ORAL_TABLET | Freq: Every day | ORAL | 1 refills | Status: AC

## 2017-12-15 MED ORDER — MIRTAZAPINE 45 MG PO TABS: 45.0000 mg | ORAL_TABLET | Freq: Every day | ORAL | 1 refills | Status: DC

## 2017-12-15 MED ORDER — FLUOXETINE HCL 20 MG PO TABS: 20.0000 mg | ORAL_TABLET | Freq: Every day | ORAL | 1 refills | Status: DC

## 2017-12-15 MED ORDER — TRAZODONE HCL 50 MG PO TABS: ORAL_TABLET | ORAL | 1 refills | Status: AC

## 2017-12-15 MED ORDER — FLUOXETINE HCL 20 MG PO TABS: 20.0000 mg | ORAL_TABLET | Freq: Every day | ORAL | 1 refills | Status: AC

## 2017-12-15 NOTE — Telephone Encounter (Signed)
done

## 2017-12-15 NOTE — Telephone Encounter (Signed)
Dr Vanetta ShawlHisada Patient is @ pharmacy . Please resubmit e-scripts [facility] pharmacy [facility] [place].  Pharmacy has been updated

## 2017-12-15 NOTE — Addendum Note (Signed)
Addended by: Neysa HotterHISADA, Shaylie Eklund on: 12/15/2017 03:30 PM   Modules accepted: Orders

## 2017-12-15 NOTE — Patient Instructions (Addendum)
1. Decrease lexapro 10 mg daily for one week, then discontinue 2. Start fluoxetine 20 mg daily  3. Continue mirtazapine 45 mg at night  3. Start Trazodone 25-50 mg at night as needed for insomnia 4. Obtain record from neurologist 5. Return to clinic in one to two months for 30 mins 6. Referral to therapy

## 2017-12-23 ENCOUNTER — Encounter: Payer: Self-pay | Admitting: Gastroenterology

## 2017-12-23 ENCOUNTER — Ambulatory Visit: Payer: BLUE CROSS/BLUE SHIELD | Admitting: Gastroenterology

## 2017-12-23 ENCOUNTER — Telehealth: Payer: Self-pay

## 2017-12-23 VITALS — BP 132/74 | HR 53 | Temp 97.3°F | Ht 70.0 in | Wt 187.2 lb

## 2017-12-23 DIAGNOSIS — K219 Gastro-esophageal reflux disease without esophagitis: Secondary | ICD-10-CM | POA: Insufficient documentation

## 2017-12-23 DIAGNOSIS — Z1211 Encounter for screening for malignant neoplasm of colon: Secondary | ICD-10-CM

## 2017-12-23 NOTE — Assessment & Plan Note (Signed)
Last colonoscopy in 2007 normal. No rectal bleeding. Brother with pre-cancerous polyps but first diagnosed age 62. Occasional fecal incontinence but not often; he also endorses large amounts of gas. Likely multifactorial in setting of dietary intake. Doubt occult issue. Add supplemental fiber daily, follow heart healthy diet.  Proceed with colonoscopy with Dr. Darrick PennaFields in the near future. The risks, benefits, and alternatives have been discussed in detail with the patient. They state understanding and desire to proceed.  Propofol due to polypharmacy Will request to hold Eliquis X 48 hours

## 2017-12-23 NOTE — Telephone Encounter (Signed)
Patient is 62 yo who is being followed by Purvis SheffieldKoneswaran, he had acute right MCA infarct on MRI of brain in Sept 2018. Later found to have new atrial flutter in Nov 2018. Seen  Ellery PlunkKathryn Lawrance for post hospital followup.   Will forward to GI in regard to the urgency of EGD and colonscopy. If nonurgent, would prefer the patient be evaluated by Dr. Zackery BarefootKonaswaran on 3/26. This is the first MD visit since his stroke in September. During mean time, will our review with our clinical pharmacist regarding eliquis.   Ramond DialSigned, Jalaiyah Throgmorton PA Pager: [phone]

## 2017-12-23 NOTE — Telephone Encounter (Signed)
Dr. Mayford Knifeurner, Lewie LoronAnna Boone, NP, would like to see if we can hold Eliquis x 2 days prior to a colonoscopy and EGD. Please advise .

## 2017-12-23 NOTE — Progress Notes (Signed)
Referring Provider: Wilson SingerGosrani, Nimish C, MD Primary Care Physician:  Wilson SingerGosrani, Nimish C, MD Primary GI: Dr. Darrick PennaFields   Chief Complaint  Patient presents with  . Gastroesophageal Reflux    better  . Gas  . tcs/egd    referred from ER for consult, last tcs at age 62  . Diarrhea    sometimes has accidents    HPI:   Charles Crawford is a 62 y.o. male presenting today with a history of stroke, inpatient Nov 2018 with cardiac arrhythmia  (aflutter). Started on Eliquis BID. Seen in consultation Nov 2018 while inpatient due to burping. He presents today to arrange colonoscopy for routine screening and EGD for Barrett's esophagus screening. Last colonoscopy in 2007 by Dr. Russella DarStark with torturous colon, moderately difficult.   Protonix BID working very well. Has steered clear of carbonated drinks since last seen in Nov while inpatient. Noting frequent urination. States sometimes he will drink a bottle of water and it goes straight through him like liquid diarrhea. Not often. Passing gas sometimes with fecal smear. Noting significant gas. Prior to strokes would drink a cup of coffee in the morning and have a soft BM. Will note intermittent constipation since the stroke and occasional fecal incontinence. Loves carbs. Not a big veggie eater. No rectal bleeding.    Past Medical History:  Diagnosis Date  . CVA (cerebral vascular accident) (HCC)   . Depression   . Hyperlipidemia   . Hypertension   . Renal insufficiency   . Tobacco abuse   . Vascular dementia     Past Surgical History:  Procedure Laterality Date  . APPENDECTOMY    . BACK SURGERY    . COLONOSCOPY  10/2006   Dr. Russella DarStark: normal colon. moderately difficult due to torturous colon  . INGUINAL HERNIA REPAIR    . TEE WITHOUT CARDIOVERSION N/A 08/11/2017   Procedure: TRANSESOPHAGEAL ECHOCARDIOGRAM (TEE);  Surgeon: Laqueta LindenKoneswaran, Suresh A, MD;  Location: [facility];  Service: Cardiovascular;  Laterality: N/A;    Current Outpatient  Medications  Medication Sig Dispense Refill  . ALPRAZolam (XANAX) 0.5 MG tablet Take 0.5 mg by mouth 2 (two) times daily.     Marland Kitchen. apixaban (ELIQUIS) 5 MG TABS tablet Take 1 tablet (5 mg total) by mouth 2 (two) times daily. 60 tablet 11  . atorvastatin (LIPITOR) 40 MG tablet Take 1 tablet (40 mg total) by mouth daily at 6 PM. 90 tablet 2  . donepezil (ARICEPT) 5 MG tablet Take 5 mg by mouth at bedtime.    Marland Kitchen. FLUoxetine (PROZAC) 20 MG tablet Take 1 tablet (20 mg total) by mouth daily. 30 tablet 1  . metoprolol tartrate (LOPRESSOR) 25 MG tablet Take 1 tablet (25 mg total) by mouth 2 (two) times daily. 180 tablet 2  . mirtazapine (REMERON) 45 MG tablet Take 1 tablet (45 mg total) by mouth at bedtime. 30 tablet 1  . pantoprazole (PROTONIX) 40 MG tablet Take 1 tablet (40 mg total) by mouth 2 (two) times daily before a meal. 180 tablet 2  . traZODone (DESYREL) 50 MG tablet 25-50 mg at night as needed for sleep 30 tablet 1   No current facility-administered medications for this visit.     Allergies as of 12/23/2017  . (No Known Allergies)    Family History  Problem Relation Age of Onset  . CVA Mother   . Depression Mother   . CAD Father   . Heart attack Father   . Alcohol abuse  Father   . Hypertension Sister   . CVA Maternal Grandmother   . CVA Maternal Grandfather   . Heart attack Paternal Grandmother   . Alcohol abuse Brother   . Colon polyps Brother 67  . Colon cancer Neg Hx     Social History   Socioeconomic History  . Marital status: Widowed    Spouse name: None  . Number of children: None  . Years of education: None  . Highest education level: None  Social Needs  . Financial resource strain: None  . Food insecurity - worry: None  . Food insecurity - inability: None  . Transportation needs - medical: None  . Transportation needs - non-medical: None  Occupational History  . None  Tobacco Use  . Smoking status: Current Every Day Smoker    Types: E-cigarettes    Last  attempt to quit: 12/05/2010    Years since quitting: 7.0  . Smokeless tobacco: Never Used  . Tobacco comment: vapes  Substance and Sexual Activity  . Alcohol use: No    Comment: former heavy drinker (sober since 1999, still goes to AA)   . Drug use: No    Comment: history of cocaine use   . Sexual activity: None  Other Topics Concern  . None  Social History Narrative  . None    Review of Systems: Gen: Denies fever, chills, anorexia. Denies fatigue, weakness, weight loss.  CV: Denies chest pain, palpitations, syncope, peripheral edema, and claudication. Resp: Denies dyspnea at rest, cough, wheezing, coughing up blood, and pleurisy. GI: see HPI  Derm: Denies rash, itching, dry skin Psych: Denies depression, anxiety, memory loss, confusion. No homicidal or suicidal ideation.  Heme: Denies bruising, bleeding, and enlarged lymph nodes.  Physical Exam: BP 132/74   Pulse (!) 53   Temp (!) 97.3 F (36.3 C) (Oral)   Ht 5\' 10"  (1.778 m)   Wt 187 lb 3.2 oz (84.9 kg)   BMI 26.86 kg/m  General:   Alert and oriented. No distress noted. Pleasant and cooperative.  Head:  Normocephalic and atraumatic. Eyes:  Conjuctiva clear without scleral icterus. Mouth:  Oral mucosa pink and moist.  Abdomen:  +BS, soft, non-tender and non-distended. No rebound or guarding. No HSM or masses noted. Msk:  Symmetrical without gross deformities. Normal posture. Extremities:  Without edema. Neurologic:  Alert and  oriented x4 Psych:  Alert and cooperative. Normal mood and affect.  Lab Results  Component Value Date   WBC 6.3 10/05/2017   HGB 13.3 10/05/2017   HCT 41.9 10/05/2017   MCV 97.4 10/05/2017   PLT 260 10/05/2017   Lab Results  Component Value Date   ALT 17 09/10/2017   AST 22 09/10/2017   ALKPHOS 87 09/10/2017   BILITOT 0.4 09/10/2017   Lab Results  Component Value Date   CREATININE 1.12 09/11/2017   BUN 21 (H) 09/11/2017   NA 140 09/11/2017   K 3.9 09/11/2017   CL 106 09/11/2017     CO2 26 09/11/2017

## 2017-12-23 NOTE — Patient Instructions (Addendum)
We have scheduled you for a colonoscopy and upper endoscopy with Dr. Darrick PennaFields in the near future.   We are asking to stop Eliquis for 2 days before the procedure.   I would add supplemental fiber daily to your diet (Metamucil, Benefiber, etc). I have included a healthy diet sheet to follow. Limit carbs, sweet things, as this can cause some of the symptoms you are having.  It was a pleasure to see you today. I strive to create trusting relationships with patients to provide genuine, compassionate, and quality care. I value your feedback. If you receive a survey regarding your visit,  I greatly appreciate you the taking time to fill this out.   Charles MinkAnna W. Pierre Dellarocco, PhD, ANP-BC [facility]    Heart-Healthy Eating Plan Many factors influence your heart health, including eating and exercise habits. Heart (coronary) risk increases with abnormal blood fat (lipid) levels. Heart-healthy meal planning includes limiting unhealthy fats, increasing healthy fats, and making other small dietary changes. This includes maintaining a healthy body weight to help keep lipid levels within a normal range. What is my plan? Your health care provider recommends that you:  Get no more than _________% of the total calories in your daily diet from fat.  Limit your intake of saturated fat to less than _________% of your total calories each day.  Limit the amount of cholesterol in your diet to less than _________ mg per day.  What types of fat should I choose?  Choose healthy fats more often. Choose monounsaturated and polyunsaturated fats, such as olive oil and canola oil, flaxseeds, walnuts, almonds, and seeds.  Eat more omega-3 fats. Good choices include salmon, mackerel, sardines, tuna, flaxseed oil, and ground flaxseeds. Aim to eat fish at least two times each week.  Limit saturated fats. Saturated fats are primarily found in animal products, such as meats, butter, and cream. Plant sources of saturated  fats include palm oil, palm kernel oil, and coconut oil.  Avoid foods with partially hydrogenated oils in them. These contain trans fats. Examples of foods that contain trans fats are stick margarine, some tub margarines, cookies, crackers, and other baked goods. What general guidelines do I need to follow?  Check food labels carefully to identify foods with trans fats or high amounts of saturated fat.  Fill one half of your plate with vegetables and green salads. Eat 4-5 servings of vegetables per day. A serving of vegetables equals 1 cup of raw leafy vegetables,  cup of raw or cooked cut-up vegetables, or  cup of vegetable juice.  Fill one fourth of your plate with whole grains. Look for the word "whole" as the first word in the ingredient list.  Fill one fourth of your plate with lean protein foods.  Eat 4-5 servings of fruit per day. A serving of fruit equals one medium whole fruit,  cup of dried fruit,  cup of fresh, frozen, or canned fruit, or  cup of 100% fruit juice.  Eat more foods that contain soluble fiber. Examples of foods that contain this type of fiber are apples, broccoli, carrots, beans, peas, and barley. Aim to get 20-30 g of fiber per day.  Eat more home-cooked food and less restaurant, buffet, and fast food.  Limit or avoid alcohol.  Limit foods that are high in starch and sugar.  Avoid fried foods.  Cook foods by using methods other than frying. Baking, boiling, grilling, and broiling are all great options. Other fat-reducing suggestions include: ? Removing the skin from  poultry. ? Removing all visible fats from meats. ? Skimming the fat off of stews, soups, and gravies before serving them. ? Steaming vegetables in water or broth.  Lose weight if you are overweight. Losing just 5-10% of your initial body weight can help your overall health and prevent diseases such as diabetes and heart disease.  Increase your consumption of nuts, legumes, and seeds to 4-5  servings per week. One serving of dried beans or legumes equals  cup after being cooked, one serving of nuts equals 1 ounces, and one serving of seeds equals  ounce or 1 tablespoon.  You may need to monitor your salt (sodium) intake, especially if you have high blood pressure. Talk with your health care provider or dietitian to get more information about reducing sodium. What foods can I eat? Grains  Breads, including [place], white, pita, wheat, raisin, rye, oatmeal, and [place]. Tortillas that are neither fried nor made with lard or trans fat. Low-fat rolls, including hotdog and hamburger buns and English muffins. Biscuits. Muffins. Waffles. Pancakes. Light popcorn. Whole-grain cereals. Flatbread. Melba toast. Pretzels. Breadsticks. Rusks. Low-fat snacks and crackers, including oyster, saltine, matzo, graham, animal, and rye. Rice and pasta, including brown rice and those that are made with whole wheat. Vegetables All vegetables. Fruits All fruits, but limit coconut. Meats and Other Protein Sources Lean, well-trimmed beef, veal, pork, and lamb. Chicken and [place] without skin. All fish and shellfish. Wild duck, rabbit, pheasant, and venison. Egg whites or low-cholesterol egg substitutes. Dried beans, peas, lentils, and tofu.Seeds and most nuts. Dairy Low-fat or nonfat cheeses, including ricotta, string, and mozzarella. Skim or 1% milk that is liquid, powdered, or evaporated. Buttermilk that is made with low-fat milk. Nonfat or low-fat yogurt. Beverages Mineral water. Diet carbonated beverages. Sweets and Desserts Sherbets and fruit ices. Honey, jam, marmalade, jelly, and syrups. Meringues and gelatins. Pure sugar candy, such as hard candy, jelly beans, gumdrops, mints, marshmallows, and small amounts of dark chocolate. [employer]. Eat all sweets and desserts in moderation. Fats and Oils Nonhydrogenated (trans-free) margarines. Vegetable oils, including soybean, sesame, sunflower,  olive, peanut, safflower, corn, canola, and cottonseed. Salad dressings or mayonnaise that are made with a vegetable oil. Limit added fats and oils that you use for cooking, baking, salads, and as spreads. Other Cocoa powder. Coffee and tea. All seasonings and condiments. The items listed above may not be a complete list of recommended foods or beverages. Contact your dietitian for more options. What foods are not recommended? Grains Breads that are made with saturated or trans fats, oils, or whole milk. Croissants. Butter rolls. Cheese breads. Sweet rolls. Donuts. Buttered popcorn. Chow mein noodles. High-fat crackers, such as cheese or butter crackers. Meats and Other Protein Sources Fatty meats, such as hotdogs, short ribs, sausage, spareribs, bacon, ribeye roast or steak, and mutton. High-fat deli meats, such as salami and bologna. Caviar. Domestic duck and goose. Organ meats, such as kidney, liver, sweetbreads, brains, gizzard, chitterlings, and heart. Dairy Cream, sour cream, cream cheese, and creamed cottage cheese. Whole milk cheeses, including blue (bleu), [address], [place], [place], [address], [place], [address], [place], [place], and [place]. Whole or 2% milk that is liquid, evaporated, or condensed. Whole buttermilk. Cream sauce or high-fat cheese sauce. Yogurt that is made from whole milk. Beverages Regular sodas and drinks with added sugar. Sweets and Desserts Frosting. Pudding. Cookies. Cakes other than angel food cake. Candy that has milk chocolate or white chocolate, hydrogenated fat, butter, coconut, or unknown ingredients. Buttered syrups. Full-fat  ice cream or ice cream drinks. Fats and Oils Gravy that has suet, meat fat, or shortening. Cocoa butter, hydrogenated oils, palm oil, coconut oil, palm kernel oil. These can often be found in baked products, candy, fried foods, nondairy creamers, and whipped toppings. Solid fats and shortenings, including bacon fat, salt pork, lard, and  butter. Nondairy cream substitutes, such as coffee creamers and sour cream substitutes. Salad dressings that are made of unknown oils, cheese, or sour cream. The items listed above may not be a complete list of foods and beverages to avoid. Contact your dietitian for more information. This information is not intended to replace advice given to you by your health care provider. Make sure you discuss any questions you have with your health care provider. Document Released: 08/05/2008 Document Revised: 05/16/2016 Document Reviewed: 04/20/2014 Elsevier Interactive Patient Education  [employer].

## 2017-12-23 NOTE — Progress Notes (Signed)
CC'D TO PCP °

## 2017-12-23 NOTE — Telephone Encounter (Signed)
This is Dr. Georjean ModeKonenswaren's patient

## 2017-12-23 NOTE — Assessment & Plan Note (Signed)
62 year old male with history of chronic GERD, recently with burping several months ago now resolved on Protonix BID. As per recommendations when seen inpatient Nov 2018, proceed with EGD for Barrett's screening.  Proceed with upper endoscopy in the near future with Dr. Darrick PennaFields. The risks, benefits, and alternatives have been discussed in detail with patient. They have stated understanding and desire to proceed.  Propofol due to polypharmacy Continue Protonix BID Will request to hold Eliquis X 48 hours prior

## 2017-12-24 ENCOUNTER — Other Ambulatory Visit: Payer: Self-pay

## 2017-12-24 NOTE — Telephone Encounter (Signed)
Doris: please see note by Azalee CourseHao Meng, PA. Requesting that patient be seen by cardiology 3/26 prior to colonoscopy/EGD. Please let patient know this. Otherwise, he will be able to hold Eliquis 24 hours prior. Will need to postpone colonoscopy till after that visit.

## 2017-12-24 NOTE — Telephone Encounter (Signed)
Patient with diagnosis of Afib with CVA on Eliquis for anticoagulation.    Procedure: colonoscopy Date of procedure: pending  CHADS2-VASc score of  3 (CHF, HTN, AGE, DM2, stroke/tia x 2, CAD, AGE, male)  CrCl 7983ml/min  Per office protocol, patient can hold eliquis for 24 hours prior to procedure. With history of CVA would recommend resume as soon as possible after.

## 2017-12-24 NOTE — Telephone Encounter (Signed)
LM FOR PT TO CALL BACK TO MOVE UP 3/26 APPT PER VIN BHAGAT PA

## 2017-12-28 NOTE — Telephone Encounter (Signed)
Per prior messages from colleagues, it has been determined patient will need to see cardiology back before clearing to come off blood thinner. Appt scheduled 3/26 but prior nurse note indicates request from Vin Bhagat PA-C to move appointment up at which time they left a message for patient to call back. Call back staff, please try to make contact with patient.  No need for this message to remain in the APP pool as further recommendations will be forthcoming from his appointment with Dr. Purvis SheffieldKoneswaran in March. Will remove from APP pool. Will also cc to Dr. Purvis SheffieldKoneswaran only as Lorain ChildesFYI.  Davinia Riccardi PA-C

## 2017-12-28 NOTE — Telephone Encounter (Signed)
Called pt re: Cardiac Clearance and his anticoagulation.  Pt is required to see Cardiology before they can decide.  I did leave a message for pt to call back.

## 2017-12-29 NOTE — Telephone Encounter (Signed)
LMOM for pt that he should be aware he has appt with Dr. Darl HouseholderKoneswaren prior to scheduling procedure here. To please call if questions.

## 2017-12-30 ENCOUNTER — Telehealth: Payer: Self-pay | Admitting: Adult Health

## 2017-12-30 NOTE — Telephone Encounter (Signed)
LMOM to call, he needs another appt here after his appt with Dr. Darl HouseholderKoneswaren on 02/02/2018. Mailing a letter also to pt.

## 2017-12-30 NOTE — Telephone Encounter (Signed)
Routing to DS 

## 2017-12-30 NOTE — Telephone Encounter (Signed)
LMOM for a return call that he needs appt. Mailed a letter also.

## 2017-12-30 NOTE — Telephone Encounter (Signed)
Please have him follow back up with us so we can see him prior to colonoscopy.

## 2017-12-30 NOTE — Telephone Encounter (Signed)
New message   Pt's sister is returning call for nurse regarding surgical clearance, pt has also passed away. Please call

## 2017-12-30 NOTE — Telephone Encounter (Signed)
So sorry! 

## 2017-12-30 NOTE — Telephone Encounter (Signed)
So sorry to hear this. Thank you for update.

## 2017-12-30 NOTE — Telephone Encounter (Signed)
Returned call to pt's sister Dennard NipCheri she states that pt called her Monday at 245 and the only thing that he was able to say was "my heart my heart" so sister called 911 and went to pts house arrived while EMT's were there and they did CPR for 30 minutes and pronounced pt there at home. Pt is having a autopsy per sister.

## 2018-01-06 ENCOUNTER — Telehealth: Payer: Self-pay | Admitting: Gastroenterology

## 2018-01-06 NOTE — Telephone Encounter (Signed)
Noted, this letter had just been mailed the day that we found out later about his death.

## 2018-01-06 NOTE — Telephone Encounter (Signed)
Charles Crawford called to speak to DS about a letter that patient received. She was letting us know that patient is deceased. She said if we needed to contact her her number is [phone]

## 2018-01-06 NOTE — Telephone Encounter (Signed)
Routing to Charles Crawford to mail a sympathy card.

## 2018-01-06 NOTE — Telephone Encounter (Signed)
REVIEWED. Please send a card with our condolences.

## 2018-01-06 NOTE — Telephone Encounter (Signed)
Sympathy card has been sent to the family °

## 2018-01-07 ENCOUNTER — Telehealth (HOSPITAL_COMMUNITY): Payer: Self-pay | Admitting: Psychiatry

## 2018-01-07 NOTE — Telephone Encounter (Signed)
Very sorry to hear this. Thanks for the update.

## 2018-01-08 DEATH — deceased

## 2018-01-18 ENCOUNTER — Ambulatory Visit (HOSPITAL_COMMUNITY): Payer: Self-pay | Admitting: Psychiatry

## 2018-01-25 ENCOUNTER — Ambulatory Visit (HOSPITAL_COMMUNITY): Payer: BLUE CROSS/BLUE SHIELD | Admitting: Licensed Clinical Social Worker

## 2018-02-02 ENCOUNTER — Ambulatory Visit: Payer: Self-pay | Admitting: Cardiovascular Disease

## 2019-08-10 IMAGING — CT CT ANGIO HEAD
2 of 8 series · 9 of 33 positions shown · IV contrast (Isovue)
Comparison: Carotid Doppler ultrasound 08/08/2017. Head MRI/ MRA
08/07/2017.

CLINICAL DATA: Headache and slurred speech. Acute right MCA infarct
on MRI.

EXAM:
CT ANGIOGRAPHY HEAD AND NECK
TECHNIQUE: Multidetector CT imaging of the head and neck was performed using
the standard protocol during bolus administration of intravenous
contrast. Multiplanar CT image reconstructions and MIPs were
obtained to evaluate the vascular anatomy. Carotid stenosis
measurements (when applicable) are obtained utilizing NASCET
criteria, using the distal internal carotid diameter as the
denominator.
CONTRAST:  75 mL Isovue 370

[Series 4: cta head neck · axial · 0.54mm/px · z∈[+14,+394]mm · 3 of 191 slices shown]
[im 1/191  soft-tissue]
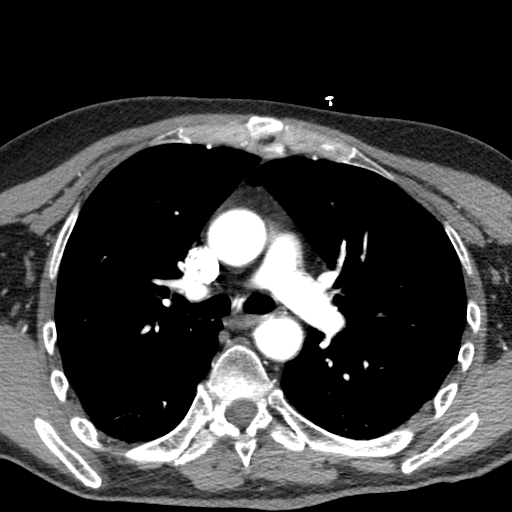
[im 96/191  bone]
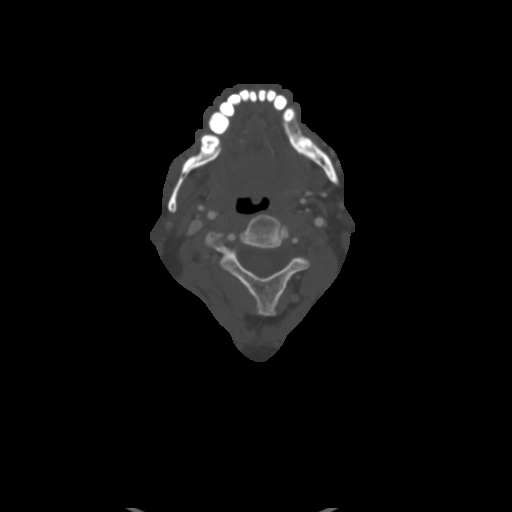
[im 191/191  soft-tissue]
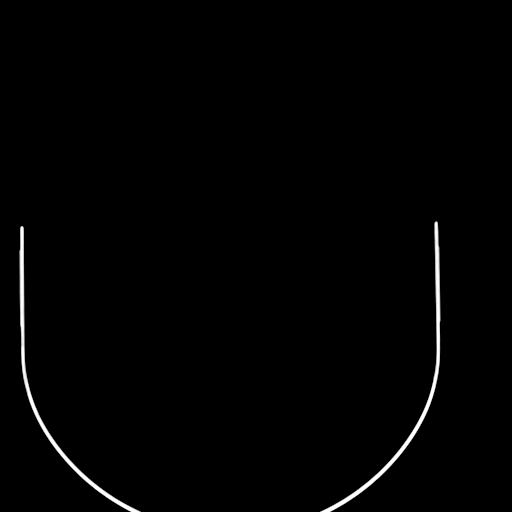

[Series 7: ax thin · axial · 0.45mm/px · z∈[+68,+338]mm · 6 of 380 slices shown]
[im 55/380  soft-tissue]
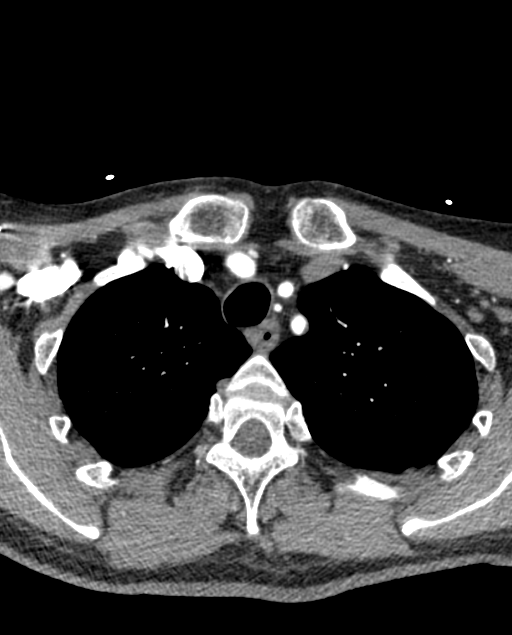
[im 109/380  soft-tissue]
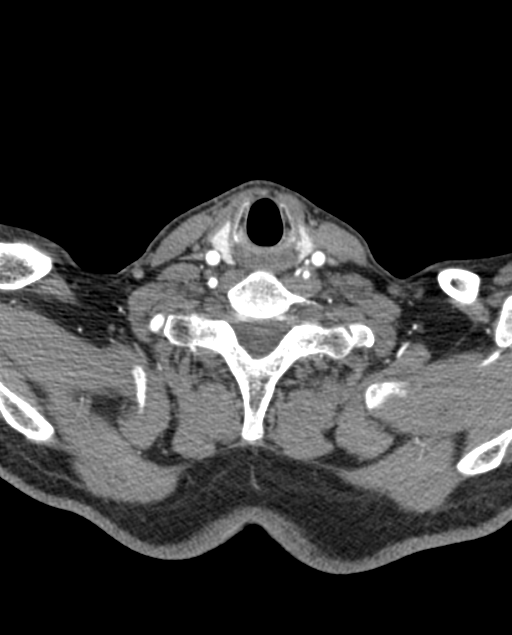
[im 163/380  soft-tissue]
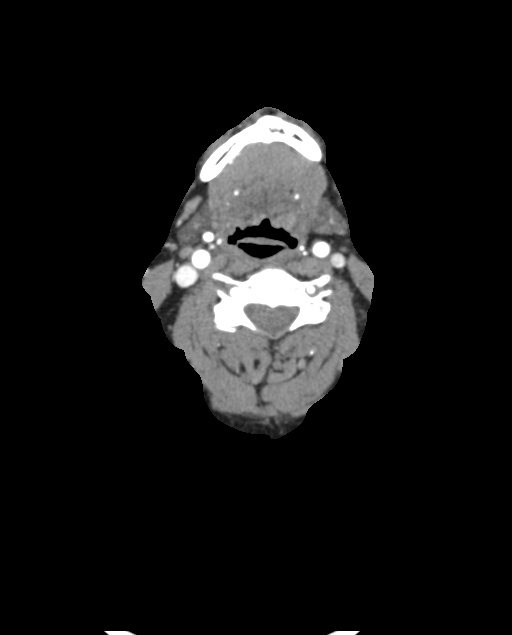
[im 217/380  soft-tissue]
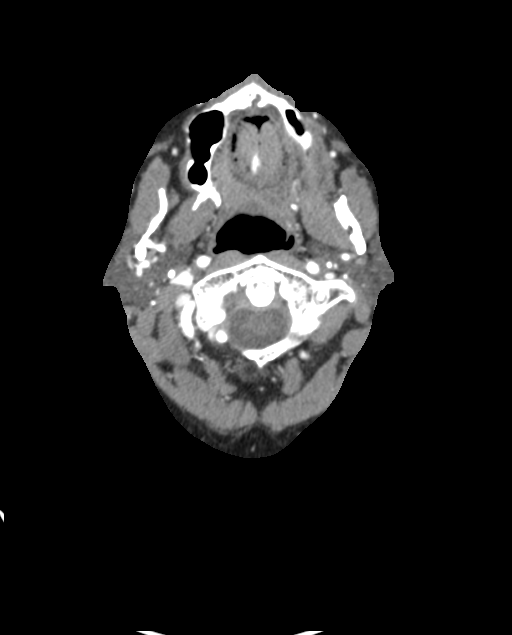
[im 271/380  soft-tissue]
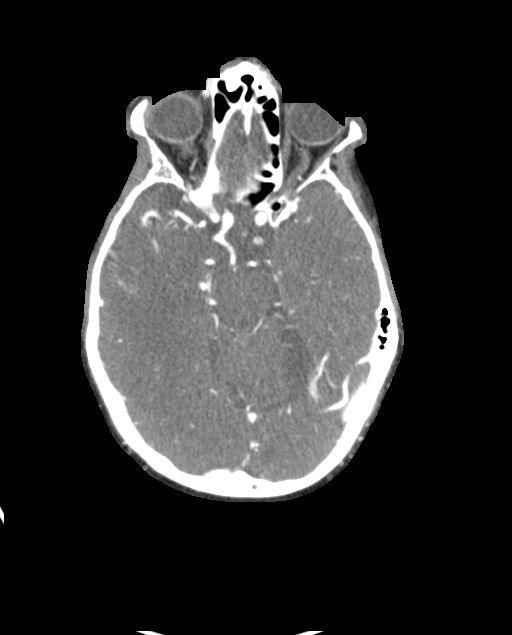
[im 325/380  soft-tissue]
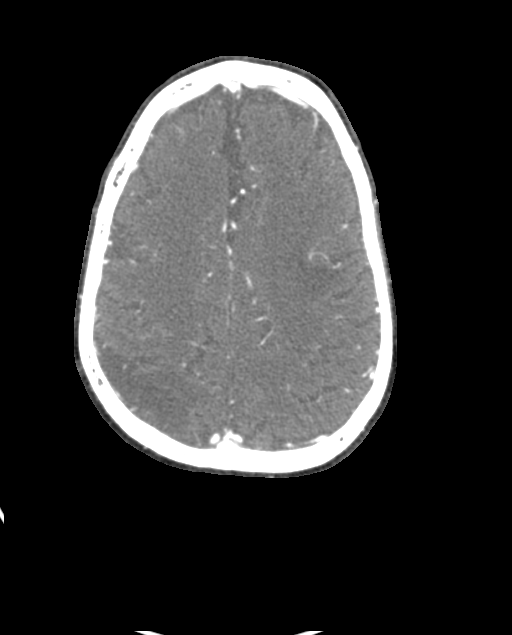

[9 of 33 positions shown; findings below may reference images not displayed]

FINDINGS: CTA NECK FINDINGS

Aortic arch: Normal variant 4 vessel aortic arch with the left
vertebral artery arising directly from the arch. Widely patent
brachiocephalic and subclavian arteries. Mild non stenotic soft
plaque in the proximal left subclavian artery.

Right carotid system: Patent without evidence of stenosis or
dissection.

Left carotid system: Patent without evidence of stenosis or
dissection.

Vertebral arteries: Patent without evidence of stenosis or
dissection. Mildly dominant right vertebral artery.

Skeleton: No acute osseous abnormality or suspicious osseous lesion.
Mild-to-moderate cervical disc degeneration.

Other neck: No mass or lymph node enlargement.

Upper chest: Unremarkable.

Review of the MIP images confirms the above findings

CTA HEAD FINDINGS

Anterior circulation: The internal carotid arteries are widely
patent from skullbase to carotid termini. The ACAs and MCAs are
patent without evidence of proximal branch occlusion or significant
stenosis. The left ACA is dominant, and there is an azygos A2
configuration. No aneurysm.

Posterior circulation: The intracranial vertebral arteries are
patent to the basilar. There is a moderate left V4 stenosis just
proximal to the PICA origin, and the more distal left vertebral
artery is hypoplastic in appearance. Patent PICA and SCA origins are
identified bilaterally. The basilar artery and is widely patent.
There are large right and small left posterior communicating
arteries with a hypoplastic right P1 segment noted. PCAs are patent
without evidence of significant stenosis. No aneurysm.

Venous sinuses: Patent.

Anatomic variants: Fetal type origin of the right PCA. Dominant left
ACA.

Delayed phase: No abnormal enhancement.

Review of the MIP images confirms the above findings
IMPRESSION: 1. Widely patent cervical carotid and vertebral arteries.
2. Moderate left V4 stenosis.
3. No significant anterior circulation stenosis.

## 2021-06-13 ENCOUNTER — Encounter (INDEPENDENT_AMBULATORY_CARE_PROVIDER_SITE_OTHER): Payer: Self-pay
